# Patient Record
Sex: Male | Born: 1995 | Race: Black or African American | Hispanic: No | Marital: Single | Smoking: Current every day smoker
Health system: Southern US, Community
[De-identification: ages and names within clinical notes are randomized; demographics above are authoritative.]

## PROBLEM LIST (undated history)

## (undated) DIAGNOSIS — F319 Bipolar disorder, unspecified: Secondary | ICD-10-CM

---
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
## 2018-05-15 ENCOUNTER — Other Ambulatory Visit: Payer: Self-pay

## 2018-05-15 ENCOUNTER — Encounter (HOSPITAL_COMMUNITY): Payer: Self-pay

## 2018-05-15 ENCOUNTER — Emergency Department (HOSPITAL_COMMUNITY)
Admission: EM | Admit: 2018-05-15 | Discharge: 2018-05-16 | Disposition: A | Discharge status: Home / Self Care | Payer: PRIVATE HEALTH INSURANCE | Attending: Emergency Medicine | Admitting: Emergency Medicine

## 2018-05-15 DIAGNOSIS — F12150 Cannabis abuse with psychotic disorder with delusions: Secondary | ICD-10-CM | POA: Diagnosis not present

## 2018-05-15 DIAGNOSIS — Z046 Encounter for general psychiatric examination, requested by authority: Secondary | ICD-10-CM | POA: Diagnosis present

## 2018-05-15 DIAGNOSIS — F17210 Nicotine dependence, cigarettes, uncomplicated: Secondary | ICD-10-CM | POA: Diagnosis not present

## 2018-05-15 DIAGNOSIS — R4689 Other symptoms and signs involving appearance and behavior: Secondary | ICD-10-CM | POA: Insufficient documentation

## 2018-05-15 LAB — COMPREHENSIVE METABOLIC PANEL
ALT: 16 U/L (ref 0–44)
AST: 20 U/L (ref 15–41)
Albumin: 4.6 g/dL (ref 3.5–5.0)
Alkaline Phosphatase: 68 U/L (ref 38–126)
Anion gap: 11 (ref 5–15)
BUN: 16 mg/dL (ref 6–20)
CHLORIDE: 106 mmol/L (ref 98–111)
CO2: 26 mmol/L (ref 22–32)
CREATININE: 1.17 mg/dL (ref 0.61–1.24)
Calcium: 9.7 mg/dL (ref 8.9–10.3)
GFR calc non Af Amer: >60 mL/min (ref >60)
Glucose, Bld: 97 mg/dL (ref 70–99)
POTASSIUM: 4.1 mmol/L (ref 3.5–5.1)
SODIUM: 143 mmol/L (ref 135–145)
Total Bilirubin: 1.2 mg/dL (ref 0.3–1.2)
Total Protein: 7.7 g/dL (ref 6.5–8.1)

## 2018-05-15 LAB — CBC
HEMATOCRIT: 51.3 % (ref 39.0–52.0)
HEMOGLOBIN: 17.9 g/dL — AB (ref 13.0–17.0)
MCH: 30 pg (ref 26.0–34.0)
MCHC: 34.9 g/dL (ref 30.0–36.0)
MCV: 85.9 fL (ref 78.0–100.0)
Platelets: 168 10*3/uL (ref 150–400)
RBC: 5.97 MIL/uL — ABNORMAL HIGH (ref 4.22–5.81)
RDW: 14 % (ref 11.5–15.5)
WBC: 9.4 10*3/uL (ref 4.0–10.5)

## 2018-05-15 LAB — ETHANOL: Alcohol, Ethyl (B): <10 mg/dL (ref <10)

## 2018-05-15 LAB — SALICYLATE LEVEL: Salicylate Lvl: <7 mg/dL (ref 2.8–30.0)

## 2018-05-15 LAB — ACETAMINOPHEN LEVEL: Acetaminophen (Tylenol), Serum: <10 ug/mL — ABNORMAL LOW (ref 10–30)
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
## 2018-05-15 NOTE — Progress Notes (Signed)
TTS consulted with Donell SievertSpencer Simon, PA who recommends inpt treatment. EDP Melene PlanFloyd, Dan, DO and pt's nurse have been advised.   Princess BruinsAquicha Ardelia Wrede, MSW, LCSW Therapeutic Triage Specialist  [phone]
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
## 2018-05-16 DIAGNOSIS — F17210 Nicotine dependence, cigarettes, uncomplicated: Secondary | ICD-10-CM

## 2018-05-16 DIAGNOSIS — F12150 Cannabis abuse with psychotic disorder with delusions: Secondary | ICD-10-CM

## 2018-05-16 LAB — RAPID URINE DRUG SCREEN, HOSP PERFORMED
Amphetamines: NOT DETECTED
BENZODIAZEPINES: NOT DETECTED
Barbiturates: NOT DETECTED
Cocaine: NOT DETECTED
Opiates: NOT DETECTED
Tetrahydrocannabinol: POSITIVE — AB

## 2018-05-16 MED ORDER — NICOTINE 21 MG/24HR TD PT24
21.0000 mg | MEDICATED_PATCH | Freq: Once | TRANSDERMAL | Status: DC
  Administered 2018-05-16: 21 mg via TRANSDERMAL
  Filled 2018-05-16: qty 1

## 2018-05-16 NOTE — ED Notes (Signed)
Patient denies pain and is resting comfortably.  

## 2018-05-16 NOTE — ED Notes (Signed)
Pt given discharge instructions with mother at bedside. No concerns voiced. Left unit ambulatory accompanied by mother. Left in stable condition.
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
## 2018-05-16 NOTE — ED Notes (Signed)
Notified patient's mom, Andreasus, of pt's discharge. Report she would be here to pick him up.

## 2018-05-16 NOTE — Discharge Instructions (Signed)
For your metal health needs, please follow up at:  [facility] [address] [place], [place] [address] [phone]

## 2018-05-24 ENCOUNTER — Emergency Department (HOSPITAL_COMMUNITY)
Admission: EM | Admit: 2018-05-24 | Discharge: 2018-05-25 | Disposition: A | Discharge status: Other Acute Inpatient Hospital | Payer: PRIVATE HEALTH INSURANCE | Attending: Emergency Medicine | Admitting: Emergency Medicine

## 2018-05-24 DIAGNOSIS — Z23 Encounter for immunization: Secondary | ICD-10-CM | POA: Insufficient documentation

## 2018-05-24 DIAGNOSIS — R45851 Suicidal ideations: Secondary | ICD-10-CM | POA: Diagnosis not present

## 2018-05-24 DIAGNOSIS — Y281XXA Contact with knife, undetermined intent, initial encounter: Secondary | ICD-10-CM | POA: Insufficient documentation

## 2018-05-24 DIAGNOSIS — F17210 Nicotine dependence, cigarettes, uncomplicated: Secondary | ICD-10-CM | POA: Insufficient documentation

## 2018-05-24 DIAGNOSIS — F3110 Bipolar disorder, current episode manic without psychotic features, unspecified: Secondary | ICD-10-CM | POA: Diagnosis present

## 2018-05-24 DIAGNOSIS — S6991XA Unspecified injury of right wrist, hand and finger(s), initial encounter: Secondary | ICD-10-CM | POA: Diagnosis present

## 2018-05-24 DIAGNOSIS — R45850 Homicidal ideations: Secondary | ICD-10-CM

## 2018-05-24 DIAGNOSIS — F3163 Bipolar disorder, current episode mixed, severe, without psychotic features: Secondary | ICD-10-CM | POA: Diagnosis present

## 2018-05-24 DIAGNOSIS — F312 Bipolar disorder, current episode manic severe with psychotic features: Secondary | ICD-10-CM | POA: Insufficient documentation

## 2018-05-24 DIAGNOSIS — Y9389 Activity, other specified: Secondary | ICD-10-CM | POA: Insufficient documentation

## 2018-05-24 DIAGNOSIS — Y929 Unspecified place or not applicable: Secondary | ICD-10-CM | POA: Diagnosis not present

## 2018-05-24 DIAGNOSIS — Y999 Unspecified external cause status: Secondary | ICD-10-CM | POA: Diagnosis not present

## 2018-05-24 DIAGNOSIS — S61411A Laceration without foreign body of right hand, initial encounter: Secondary | ICD-10-CM | POA: Diagnosis not present

## 2018-05-24 DIAGNOSIS — F12150 Cannabis abuse with psychotic disorder with delusions: Secondary | ICD-10-CM | POA: Diagnosis present

## 2018-05-24 DIAGNOSIS — F419 Anxiety disorder, unspecified: Secondary | ICD-10-CM | POA: Diagnosis not present

## 2018-05-24 DIAGNOSIS — F23 Brief psychotic disorder: Secondary | ICD-10-CM | POA: Diagnosis not present

## 2018-05-24 LAB — COMPREHENSIVE METABOLIC PANEL
ALBUMIN: 4.2 g/dL (ref 3.5–5.0)
ALK PHOS: 61 U/L (ref 38–126)
ALT: 12 U/L (ref 0–44)
AST: 16 U/L (ref 15–41)
Anion gap: 8 (ref 5–15)
BILIRUBIN TOTAL: 1.1 mg/dL (ref 0.3–1.2)
BUN: 13 mg/dL (ref 6–20)
CALCIUM: 9.5 mg/dL (ref 8.9–10.3)
CO2: 27 mmol/L (ref 22–32)
CREATININE: 1.12 mg/dL (ref 0.61–1.24)
Chloride: 108 mmol/L (ref 98–111)
GFR calc Af Amer: >60 mL/min (ref >60)
GFR calc non Af Amer: >60 mL/min (ref >60)
GLUCOSE: 84 mg/dL (ref 70–99)
POTASSIUM: 3.5 mmol/L (ref 3.5–5.1)
Sodium: 143 mmol/L (ref 135–145)
TOTAL PROTEIN: 7 g/dL (ref 6.5–8.1)

## 2018-05-24 LAB — CBC WITH DIFFERENTIAL/PLATELET
BASOS PCT: 1 %
Basophils Absolute: 0 10*3/uL (ref 0.0–0.1)
Eosinophils Absolute: 0.1 10*3/uL (ref 0.0–0.7)
Eosinophils Relative: 1 %
HCT: 46.6 % (ref 39.0–52.0)
Hemoglobin: 16.3 g/dL (ref 13.0–17.0)
Lymphocytes Relative: 20 %
Lymphs Abs: 1.3 10*3/uL (ref 0.7–4.0)
MCH: 29.7 pg (ref 26.0–34.0)
MCHC: 35 g/dL (ref 30.0–36.0)
MCV: 85 fL (ref 78.0–100.0)
MONO ABS: 0.6 10*3/uL (ref 0.1–1.0)
MONOS PCT: 9 %
NEUTROS ABS: 4.4 10*3/uL (ref 1.7–7.7)
Neutrophils Relative %: 69 %
Platelets: 138 10*3/uL — ABNORMAL LOW (ref 150–400)
RBC: 5.48 MIL/uL (ref 4.22–5.81)
RDW: 13.8 % (ref 11.5–15.5)
WBC: 6.4 10*3/uL (ref 4.0–10.5)

## 2018-05-24 LAB — ETHANOL: Alcohol, Ethyl (B): <10 mg/dL (ref <10)

## 2018-05-24 MED ORDER — LORAZEPAM 1 MG PO TABS: 1.0000 mg | ORAL_TABLET | Freq: Four times a day (QID) | ORAL | Status: DC | PRN

## 2018-05-24 MED ORDER — TRAZODONE HCL 100 MG PO TABS: 100.0000 mg | ORAL_TABLET | Freq: Every day | ORAL | Status: DC

## 2018-05-24 MED ORDER — TETANUS-DIPHTH-ACELL PERTUSSIS 5-2.5-18.5 LF-MCG/0.5 IM SUSP
0.5000 mL | Freq: Once | INTRAMUSCULAR | Status: AC
  Administered 2018-05-24: 0.5 mL via INTRAMUSCULAR
  Filled 2018-05-24 (×2): qty 0.5

## 2018-05-24 MED ORDER — OLANZAPINE 5 MG PO TABS: 5.0000 mg | ORAL_TABLET | Freq: Every day | ORAL | Status: DC

## 2018-05-24 MED ORDER — HYDROXYZINE HCL 25 MG PO TABS: 25.0000 mg | ORAL_TABLET | Freq: Three times a day (TID) | ORAL | Status: DC | PRN

## 2018-05-24 MED ORDER — ACETAMINOPHEN 325 MG PO TABS: 650.0000 mg | ORAL_TABLET | ORAL | Status: DC | PRN

## 2018-05-24 MED ORDER — ZIPRASIDONE MESYLATE 20 MG IM SOLR
20.0000 mg | Freq: Once | INTRAMUSCULAR | Status: AC | PRN
  Administered 2018-05-24: 20 mg via INTRAMUSCULAR
  Filled 2018-05-24: qty 20

## 2018-05-24 MED ORDER — ALUM & MAG HYDROXIDE-SIMETH 200-200-20 MG/5ML PO SUSP: 30.0000 mL | Freq: Four times a day (QID) | ORAL | Status: DC | PRN

## 2018-05-24 MED ORDER — STERILE WATER FOR INJECTION IJ SOLN
INTRAMUSCULAR | Status: AC
  Administered 2018-05-24: 19:00:00
  Filled 2018-05-24: qty 10

## 2018-05-24 MED ORDER — LORAZEPAM 2 MG/ML IJ SOLN: 1.0000 mg | Freq: Four times a day (QID) | INTRAMUSCULAR | Status: DC | PRN

## 2018-05-24 MED ORDER — LORAZEPAM 1 MG PO TABS
2.0000 mg | ORAL_TABLET | Freq: Once | ORAL | Status: DC
  Filled 2018-05-24: qty 2

## 2018-05-24 MED ORDER — ONDANSETRON HCL 4 MG PO TABS: 4.0000 mg | ORAL_TABLET | Freq: Three times a day (TID) | ORAL | Status: DC | PRN

## 2018-05-24 MED ORDER — LORAZEPAM 2 MG/ML IJ SOLN
2.0000 mg | Freq: Once | INTRAMUSCULAR | Status: AC | PRN
  Administered 2018-05-24: 2 mg via INTRAMUSCULAR
  Filled 2018-05-24: qty 1

## 2018-05-24 MED ORDER — DIPHENHYDRAMINE HCL 50 MG/ML IJ SOLN
25.0000 mg | Freq: Once | INTRAMUSCULAR | Status: AC | PRN
  Administered 2018-05-24: 25 mg via INTRAMUSCULAR
  Filled 2018-05-24: qty 1

## 2018-05-24 MED ORDER — BACITRACIN ZINC 500 UNIT/GM EX OINT
TOPICAL_OINTMENT | Freq: Two times a day (BID) | CUTANEOUS | Status: DC
  Administered 2018-05-24: 11:00:00 via TOPICAL
  Administered 2018-05-25: 1 via TOPICAL
  Filled 2018-05-24 (×2): qty 0.9
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
## 2018-05-24 NOTE — ED Notes (Signed)
[employer] and security outside patient room.

## 2018-05-24 NOTE — ED Notes (Signed)
Security presence outside patient door.

## 2018-05-24 NOTE — ED Notes (Signed)
Security has been called to stand by outside patient room due to patient being verbally aggressive.

## 2018-05-24 NOTE — ED Notes (Signed)
Pt becoming verbally aggressive and threatening staff

## 2018-05-24 NOTE — ED Notes (Signed)
Pt angry and yelling at MHT. MHT and RN attempting to verbally deescalate pt. Pt becoming more aggressive during attempted conversation.  Off duty officer attempting to have a conversation with patient regarding involuntary commitment. Pt screaming at RN and MHT that he does not have to be here and he can leave if he wants to.
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
## 2018-05-24 NOTE — BHH Counselor (Signed)
Patient assessed and case staffed with Elta GuadeloupeLaurie Parks, NP, who recommends patient meets criteria for inpatient hospitalizations.

## 2018-05-24 NOTE — ED Notes (Signed)
[facility] Expected date:  Expected time:  Means of arrival:  Comments: Hold for ED 18

## 2018-05-24 NOTE — ED Notes (Signed)
Pt heard on the phone threatening people and stating "I could kill you bro!" and pt becoming more verbally aggressive.

## 2018-05-24 NOTE — ED Notes (Signed)
Patient right anterior palm has been cleaned and dressed.

## 2018-05-24 NOTE — ED Notes (Signed)
Pt had multiple small lacerations on his hands and RN put Band-Aid on his fingers and placed telfa and coband on pt's left thumb

## 2018-05-24 NOTE — ED Notes (Signed)
Security outside patient room

## 2018-05-24 NOTE — ED Notes (Signed)
Patient escorted to [facility] by Security, [employer] and [place].

## 2018-05-24 NOTE — ED Notes (Signed)
Pt sleeping at present, sedated,  Arouseable to deep verbal stimuli.  Skin color good, resp even and unlabored.  Monitoring for safety, Q 15 min checks in effect.
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
## 2018-05-24 NOTE — ED Notes (Signed)
Pt has been dressed out in purple scrubs / wanded by security and has safety sitter at bedside.

## 2018-05-25 ENCOUNTER — Encounter (HOSPITAL_COMMUNITY): Payer: Self-pay | Admitting: *Deleted

## 2018-05-25 ENCOUNTER — Inpatient Hospital Stay (HOSPITAL_COMMUNITY)
Admission: AD | Admit: 2018-05-25 | Discharge: 2018-05-29 | DRG: 885 | Disposition: A | Discharge status: Home / Self Care | Payer: PRIVATE HEALTH INSURANCE | Source: Intra-hospital | Attending: Psychiatry | Admitting: Psychiatry

## 2018-05-25 ENCOUNTER — Other Ambulatory Visit: Payer: Self-pay

## 2018-05-25 DIAGNOSIS — F419 Anxiety disorder, unspecified: Secondary | ICD-10-CM | POA: Diagnosis present

## 2018-05-25 DIAGNOSIS — R45850 Homicidal ideations: Secondary | ICD-10-CM

## 2018-05-25 DIAGNOSIS — Z590 Homelessness: Secondary | ICD-10-CM | POA: Diagnosis not present

## 2018-05-25 DIAGNOSIS — F17210 Nicotine dependence, cigarettes, uncomplicated: Secondary | ICD-10-CM | POA: Diagnosis present

## 2018-05-25 DIAGNOSIS — F3163 Bipolar disorder, current episode mixed, severe, without psychotic features: Secondary | ICD-10-CM | POA: Diagnosis present

## 2018-05-25 DIAGNOSIS — F3110 Bipolar disorder, current episode manic without psychotic features, unspecified: Secondary | ICD-10-CM | POA: Diagnosis present

## 2018-05-25 DIAGNOSIS — F602 Antisocial personality disorder: Secondary | ICD-10-CM | POA: Diagnosis present

## 2018-05-25 DIAGNOSIS — Z915 Personal history of self-harm: Secondary | ICD-10-CM

## 2018-05-25 DIAGNOSIS — R45851 Suicidal ideations: Secondary | ICD-10-CM | POA: Diagnosis present

## 2018-05-25 DIAGNOSIS — F23 Brief psychotic disorder: Secondary | ICD-10-CM | POA: Diagnosis not present

## 2018-05-25 DIAGNOSIS — F1220 Cannabis dependence, uncomplicated: Secondary | ICD-10-CM | POA: Diagnosis not present

## 2018-05-25 DIAGNOSIS — F1210 Cannabis abuse, uncomplicated: Secondary | ICD-10-CM | POA: Diagnosis present

## 2018-05-25 DIAGNOSIS — S61411A Laceration without foreign body of right hand, initial encounter: Secondary | ICD-10-CM | POA: Diagnosis not present

## 2018-05-25 DIAGNOSIS — G4700 Insomnia, unspecified: Secondary | ICD-10-CM | POA: Diagnosis present

## 2018-05-25 DIAGNOSIS — J00 Acute nasopharyngitis [common cold]: Secondary | ICD-10-CM | POA: Diagnosis present

## 2018-05-25 DIAGNOSIS — Z639 Problem related to primary support group, unspecified: Secondary | ICD-10-CM | POA: Diagnosis not present

## 2018-05-25 LAB — RAPID URINE DRUG SCREEN, HOSP PERFORMED
AMPHETAMINES: NOT DETECTED
BARBITURATES: NOT DETECTED
BENZODIAZEPINES: POSITIVE — AB
Cocaine: NOT DETECTED
Opiates: NOT DETECTED
Tetrahydrocannabinol: POSITIVE — AB

## 2018-05-25 MED ORDER — TRAZODONE HCL 100 MG PO TABS
100.0000 mg | ORAL_TABLET | Freq: Every day | ORAL | Status: DC
  Filled 2018-05-25 (×3): qty 1

## 2018-05-25 MED ORDER — ONDANSETRON HCL 4 MG PO TABS: 4.0000 mg | ORAL_TABLET | Freq: Three times a day (TID) | ORAL | Status: DC | PRN

## 2018-05-25 MED ORDER — HYDROXYZINE HCL 25 MG PO TABS: 25.0000 mg | ORAL_TABLET | Freq: Three times a day (TID) | ORAL | Status: DC | PRN

## 2018-05-25 MED ORDER — NICOTINE 21 MG/24HR TD PT24
21.0000 mg | MEDICATED_PATCH | Freq: Every day | TRANSDERMAL | Status: DC
  Filled 2018-05-25 (×3): qty 1

## 2018-05-25 MED ORDER — LORAZEPAM 2 MG/ML IJ SOLN
1.0000 mg | Freq: Four times a day (QID) | INTRAMUSCULAR | Status: AC | PRN
  Filled 2018-05-25: qty 1

## 2018-05-25 MED ORDER — OLANZAPINE 5 MG PO TABS
5.0000 mg | ORAL_TABLET | Freq: Every day | ORAL | Status: DC
  Filled 2018-05-25 (×3): qty 1

## 2018-05-25 MED ORDER — MAGNESIUM HYDROXIDE 400 MG/5ML PO SUSP: 30.0000 mL | Freq: Every day | ORAL | Status: DC | PRN

## 2018-05-25 MED ORDER — ACETAMINOPHEN 325 MG PO TABS
650.0000 mg | ORAL_TABLET | Freq: Four times a day (QID) | ORAL | Status: DC | PRN
  Administered 2018-05-26 – 2018-05-29 (×2): 650 mg via ORAL
  Filled 2018-05-25 (×2): qty 2

## 2018-05-25 MED ORDER — ALUM & MAG HYDROXIDE-SIMETH 200-200-20 MG/5ML PO SUSP: 30.0000 mL | ORAL | Status: DC | PRN

## 2018-05-25 MED ORDER — HYDROXYZINE HCL 25 MG PO TABS
25.0000 mg | ORAL_TABLET | Freq: Three times a day (TID) | ORAL | Status: DC | PRN
  Administered 2018-05-25: 25 mg via ORAL
  Filled 2018-05-25: qty 1

## 2018-05-25 MED ORDER — LORAZEPAM 1 MG PO TABS
1.0000 mg | ORAL_TABLET | Freq: Four times a day (QID) | ORAL | Status: AC | PRN
  Administered 2018-05-25: 1 mg via ORAL

## 2018-05-25 MED ORDER — NICOTINE POLACRILEX 2 MG MT GUM: 2.0000 mg | CHEWING_GUM | OROMUCOSAL | Status: DC | PRN

## 2018-05-25 MED ORDER — NICOTINE 21 MG/24HR TD PT24: 21.0000 mg | MEDICATED_PATCH | Freq: Every day | TRANSDERMAL | Status: DC

## 2018-05-25 MED ORDER — BACITRACIN ZINC 500 UNIT/GM EX OINT
TOPICAL_OINTMENT | Freq: Two times a day (BID) | CUTANEOUS | Status: DC
  Administered 2018-05-26: 10:00:00 via TOPICAL
  Administered 2018-05-26: 1 via TOPICAL
  Administered 2018-05-27 – 2018-05-29 (×5): via TOPICAL
  Filled 2018-05-25: qty 28.35

## 2018-05-25 MED ORDER — LORAZEPAM 1 MG PO TABS
1.0000 mg | ORAL_TABLET | ORAL | Status: DC | PRN
  Filled 2018-05-25: qty 1

## 2018-05-25 MED ORDER — ZIPRASIDONE MESYLATE 20 MG IM SOLR
20.0000 mg | Freq: Two times a day (BID) | INTRAMUSCULAR | Status: DC | PRN
  Administered 2018-05-25: 20 mg via INTRAMUSCULAR
  Filled 2018-05-25 (×2): qty 20

## 2018-05-25 MED ORDER — TRAZODONE HCL 50 MG PO TABS
50.0000 mg | ORAL_TABLET | Freq: Every evening | ORAL | Status: DC | PRN
  Administered 2018-05-26: 50 mg via ORAL
  Filled 2018-05-25: qty 1

## 2018-05-25 NOTE — ED Notes (Signed)
Wounds to right hand cleansed and bacitracin applied, covered with dsg.

## 2018-05-25 NOTE — Progress Notes (Signed)
Dar Note: Staff returned patient back from [employer] with blunted affect and irritable mood.  Patient states he doesn't want to be there because he wasn't hungry.  Patient was on the phone yelling and threatening to leave against medical advice.  Patient slammed the phone down and walked to his room kicking the garbage can.    Patient received Ativan 1 mg with Vistaril 25 mg after several attempts and encouragement.  Routine safety checks maintained every 15 minutes.  Continues to need a lot of redirection on the unit.

## 2018-05-25 NOTE — ED Notes (Signed)
Pt is very labile.  He states that he "Always want to kill people and make them suffer.  He ask if I know what [place] means,  Stated it means you cant have sympathy in this world, it gets you Nowhere."  He also states that he hasnt eaten in 5 days.   He denies S/I but endorses H/I.  Pt denies AVH.   15 minute checks and video monitoring continue.

## 2018-05-25 NOTE — Progress Notes (Signed)
Patient ID: Larry [name], male   DOB: 10/13/1995, 22 y.o.   MRN: [id number]  MHT reports patient spit out his oral medications. Patient stated, "I didn't take that shit". Patient then reported, "I am leaving tonight".

## 2018-05-25 NOTE — ED Notes (Signed)
Pt discharged safely with [employer].  Pt was calm and cooperative at discharge.  All belongings were sent with patient.

## 2018-05-25 NOTE — Progress Notes (Signed)
Did not attend group 

## 2018-05-25 NOTE — Progress Notes (Signed)
Patient ID: Maudry Mayhewony Orren, male   DOB: 04/19/1996, 22 y.o.   MRN: [id number]  Patient became verbally aggressive on the phone. Patient threw the phone receiver against the wall and started to curse/yell in the hallway. MHT attempted to verbally deescalate patient but patient refused to engage. Patient then attempted to elope out the 500 hall entrance and began to block the doorway. Patient continued to demand to leave and would not engage with staff. Security, AC and on-coming shift notified. Show of force was called. Patient verbally agreed to take IM Geodon and IM Ativan into his L deltoid. Injections received at 1714. Patient tolerated injection well and endorsed some injection site discomfort. Patient remains agitated but is observed sitting by the hallway telephone. Will continue to monitor and support.

## 2018-05-25 NOTE — Tx Team (Signed)
Initial Treatment Plan 05/25/2018 5:21 PM Larry Mayhewony Oien [id number]    PATIENT STRESSORS: Financial difficulties Legal issue Marital or family conflict Medication change or noncompliance Substance abuse   PATIENT STRENGTHS: Ability for insight Average or above average intelligence Capable of independent living General fund of knowledge   PATIENT IDENTIFIED PROBLEMS: Depression Suicidal thoughts Homicidal thoughts "I don't know but I want something different but I'm going right back to the same thing when I get out of here"                     DISCHARGE CRITERIA:  Ability to meet basic life and health needs Improved stabilization in mood, thinking, and/or behavior Reduction of life-threatening or endangering symptoms to within safe limits Verbal commitment to aftercare and medication compliance  PRELIMINARY DISCHARGE PLAN: Attend aftercare/continuing care group Return to previous living arrangement  PATIENT/FAMILY INVOLVEMENT: This treatment plan has been presented to and reviewed with the patient, Larry [name], and/or family member, .  The patient and family have been given the opportunity to ask questions and make suggestions.  Arlone Lenhardt, CatarinaBrook Wayne, [place]RN 05/25/2018, 5:21 PM

## 2018-05-25 NOTE — Consult Note (Addendum)
[facility] Consult   Reason for Consult:  Aggressive behavior Referring Physician:  EDP Patient Identification: Larry [name] MRN:  [id number] Principal Diagnosis: Acute psychosis [facility]) Diagnosis:   Patient Active Problem List   Diagnosis Date Noted  . 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 male patient admitted with psychosis with aggressive behavior.  HPI:  Pt was seen and chart reviewed with treatment team and Dr Mariea Clonts. Pt stated he has thoughts of wanting to kill people but stated " If you make me mad then I would kill your mother so you would suffer over that." He also stated he is angry and gets aggressive and breaks things. He stated he used to take medication and it helped but then he flushed it down the toilet because "why should I take pills?" Pt stated he has memory loss. He also stated he smokes 40 blunts and 100 cigarettes per day. Pt is tangential and appears to be responding to internal stimuli. Pt's UDS positive for THC and benzodiazepines, BAL negative. Pt does not get along with his step-dad and his step-dad is afraid for Pt's mother if pt is left alone with her because he is unstable. Pt would benefit from an inpatient psychiatric admission for crisis stabilization and medication management.   Past Psychiatric History: As above  Risk to Self: None. Denies SI.  Comment - Self Injurious Behavior: pt has hx of cutting  Risk to Others: Homicidal Ideation: Yes-Currently Present(pt report he will kill others, no specific person discussed ) Thoughts of Harm to Others: Yes-Currently Present Comment - Thoughts of Harm to Others: pt report thoughts of wanting to kill others, no specific person  Current Homicidal Intent: No Current Homicidal Plan: No Access to Homicidal Means: No Identified Victim: no specific person discussed  History of  harm to others?: Yes Assessment of Violence: On admission Violent Behavior Description: pt states if he's having a bad day, everyone will have a bad dad(report if he suffers, everyone suffers) Does patient have access to weapons?: No Criminal Charges Pending?: Yes Describe Pending Criminal Charges: destruction of property(not sure of court date) Does patient have a court date: Yes(patient not sure of his court date) Court Date: (pt unsure of court date) Prior Inpatient Therapy: Prior Inpatient Therapy: No Prior Outpatient Therapy: Prior Outpatient Therapy: Yes Prior Therapy Dates: current Prior Therapy Facilty/Provider(s): unknown Reason for Treatment: anger management  Does patient have an ACCT team?: No Does patient have Intensive In-House Services?  : No Does patient have [facility] services? : No Does patient have P4CC services?: No  Past Medical History: No past medical history on file. No past surgical history on file. Family History: No family history on file. Family Psychiatric  History: Unknown  Social History:  Social History   Substance and Sexual Activity  Alcohol Use No     Social History   Substance and Sexual Activity  Drug Use No    Social History   Socioeconomic History  . Marital status: Single    Spouse name: Not on file  . Number of children: Not on file  . Years of education: Not on file  . Highest education level: Not on file  Occupational History  . Not on file  Social Needs  . Financial resource strain: Not on file  . Food insecurity:    Worry: Not on file  Inability: Not on file  . Transportation needs:    Medical: Not on file    Non-medical: Not on file  Tobacco Use  . Smoking status: Current Every Day Smoker    Types: Cigarettes  Substance and Sexual Activity  . Alcohol use: No  . Drug use: No  . Sexual activity: Not on file  Lifestyle  . Physical activity:    Days per week: Not on file    Minutes per session: Not on file  .  Stress: Not on file  Relationships  . Social connections:    Talks on phone: Not on file    Gets together: Not on file    Attends religious service: Not on file    Active member of club or organization: Not on file    Attends meetings of clubs or organizations: Not on file    Relationship status: Not on file  Other Topics Concern  . Not on file  Social History Narrative  . Not on file   Additional Social History: He lives at home with his mother and stepfather. He smokes marijuana daily.     Allergies:   Allergies  Allergen Reactions  . Zithromax [Azithromycin Dihydrate] Rash    Labs:  Results for orders placed or performed during the hospital encounter of 05/24/18 (from the past 48 hour(s))  Comprehensive metabolic panel     Status: None   Collection Time: 05/24/18 11:30 AM  Result Value Ref Range   Sodium 143 135 - 145 mmol/L   Potassium 3.5 3.5 - 5.1 mmol/L   Chloride 108 98 - 111 mmol/L   CO2 27 22 - 32 mmol/L   Glucose, Bld 84 70 - 99 mg/dL   BUN 13 6 - 20 mg/dL   Creatinine, Ser 1.12 0.61 - 1.24 mg/dL   Calcium 9.5 8.9 - 10.3 mg/dL   Total Protein 7.0 6.5 - 8.1 g/dL   Albumin 4.2 3.5 - 5.0 g/dL   AST 16 15 - 41 U/L   ALT 12 0 - 44 U/L   Alkaline Phosphatase 61 38 - 126 U/L   Total Bilirubin 1.1 0.3 - 1.2 mg/dL   GFR calc non Af Amer >60 >60 mL/min   GFR calc Af Amer >60 >60 mL/min    Comment: (NOTE) The eGFR has been calculated using the CKD EPI equation. This calculation has not been validated in all clinical situations. eGFR's persistently <60 mL/min signify possible Chronic Kidney Disease.    Anion gap 8 5 - 15    Comment: Performed at [facility], [place] [address] [place], [place] [address]  Ethanol     Status: None   Collection Time: 05/24/18 11:30 AM  Result Value Ref Range   Alcohol, Ethyl (B) <10 <10 mg/dL    Comment: (NOTE) Lowest detectable limit for serum alcohol is 10 mg/dL. For medical purposes only. Performed at [facility], [place] [address], [place], [place] [address]   CBC with Diff     Status: Abnormal   Collection Time: 05/24/18 11:30 AM  Result Value Ref Range   WBC 6.4 4.0 - 10.5 K/uL   RBC 5.48 4.22 - 5.81 MIL/uL   Hemoglobin 16.3 13.0 - 17.0 g/dL   HCT 46.6 39.0 - 52.0 %   MCV 85.0 78.0 - 100.0 fL   MCH 29.7 26.0 - 34.0 pg   MCHC 35.0 30.0 - 36.0 g/dL   RDW 13.8 11.5 - 15.5 %   Platelets 138 (L)  150 - 400 K/uL   Neutrophils Relative % 69 %   Neutro Abs 4.4 1.7 - 7.7 K/uL   Lymphocytes Relative 20 %   Lymphs Abs 1.3 0.7 - 4.0 K/uL   Monocytes Relative 9 %   Monocytes Absolute 0.6 0.1 - 1.0 K/uL   Eosinophils Relative 1 %   Eosinophils Absolute 0.1 0.0 - 0.7 K/uL   Basophils Relative 1 %   Basophils Absolute 0.0 0.0 - 0.1 K/uL    Comment: Performed at [facility], [place] [address] [place], [place] [address]  Urine rapid drug screen (hosp performed)     Status: Abnormal   Collection Time: 05/25/18  9:03 AM  Result Value Ref Range   Opiates NONE DETECTED NONE DETECTED   Cocaine NONE DETECTED NONE DETECTED   Benzodiazepines POSITIVE (A) NONE DETECTED   Amphetamines NONE DETECTED NONE DETECTED   Tetrahydrocannabinol POSITIVE (A) NONE DETECTED   Barbiturates NONE DETECTED NONE DETECTED    Comment: (NOTE) DRUG SCREEN FOR MEDICAL PURPOSES ONLY.  IF CONFIRMATION IS NEEDED FOR ANY PURPOSE, NOTIFY LAB WITHIN 5 DAYS. LOWEST DETECTABLE LIMITS FOR URINE DRUG SCREEN Drug Class                     Cutoff (ng/mL) Amphetamine and metabolites    1000 Barbiturate and metabolites    200 Benzodiazepine                 366 Tricyclics and metabolites     300 Opiates and metabolites        300 Cocaine and metabolites        300 THC                            50 Performed at [facility], [place] [address], [place], [place] [address]     Current Facility-Administered Medications  Medication Dose Route Frequency Provider Last Rate Last Dose   . acetaminophen (TYLENOL) tablet 650 mg  650 mg Oral Q4H PRN Duffy Bruce, MD      . alum & mag hydroxide-simeth (MAALOX/MYLANTA) 200-200-20 MG/5ML suspension 30 mL  30 mL Oral Q6H PRN Duffy Bruce, MD      . bacitracin ointment   Topical BID Duffy Bruce, MD   1 application at 74/25/95 0555  . hydrOXYzine (ATARAX/VISTARIL) tablet 25 mg  25 mg Oral TID PRN Ethelene Hal, NP      . LORazepam (ATIVAN) injection 1 mg  1 mg Intramuscular Q6H PRN Ethelene Hal, NP       Or  . LORazepam (ATIVAN) tablet 1 mg  1 mg Oral Q6H PRN Ethelene Hal, NP      . nicotine (NICODERM CQ - dosed in mg/24 hours) patch 21 mg  21 mg Transdermal Daily Faythe Dingwall, DO   Stopped at 05/25/18 1353  . nicotine polacrilex (NICORETTE) gum 2 mg  2 mg Oral PRN Ethelene Hal, NP      . OLANZapine [facility]) tablet 5 mg  5 mg Oral QHS Ethelene Hal, NP   Stopped at 05/24/18 2124  . ondansetron (ZOFRAN) tablet 4 mg  4 mg Oral Q8H PRN Duffy Bruce, MD      . traZODone (DESYREL) tablet 100 mg  100 mg Oral QHS Ethelene Hal, NP   Stopped at 05/24/18 2124   No current outpatient medications on file.    Musculoskeletal: Strength & Muscle Tone:  within normal limits Gait & Station: normal Patient leans: N/A  Psychiatric Specialty Exam: Physical Exam  Nursing note and vitals reviewed. Constitutional: He is oriented to person, place, and time. He appears well-developed and well-nourished.  HENT:  Head: Normocephalic and atraumatic.  Neck: Normal range of motion.  Respiratory: Effort normal.  Musculoskeletal: Normal range of motion.  Neurological: He is alert and oriented to person, place, and time.  Psychiatric: His mood appears anxious. His affect is labile. His speech is rapid and/or pressured. He is agitated. Thought content is delusional. Cognition and memory are normal. He expresses impulsivity.    Review of Systems  Psychiatric/Behavioral: Positive for  substance abuse. Negative for depression, hallucinations, memory loss and suicidal ideas. The patient is nervous/anxious. The patient does not have insomnia.   All other systems reviewed and are negative.   Blood pressure 133/88, pulse (!) 58, temperature 98 F (36.7 C), temperature source Oral, resp. rate 14, SpO2 100 %.There is no height or weight on file to calculate BMI.  General Appearance: Casual  Eye Contact:  Good  Speech:  Clear and Coherent and Pressured  Volume:  Normal  Mood:  Anxious and Irritable  Affect:  Congruent and Labile  Thought Process:  Disorganized  Orientation:  Full (Time, Place, and Person)  Thought Content:  Illogical  Suicidal Thoughts:  No  Homicidal Thoughts:  Yes.  with intent/plan  Memory:  Immediate;   Good Recent;   Fair Remote;   Fair  Judgement:  Poor  Insight:  Lacking  Psychomotor Activity:  Increased  Concentration:  Concentration: Fair and Attention Span: Fair  Recall:  [employer] of Knowledge:  Fair  Language:  Good  Akathisia:  No  Handed:  Right  AIMS (if indicated):   N/A  Assets:  Agricultural consultant Housing  ADL's:  Intact  Cognition:  WNL  Sleep:   Fair     Treatment Plan Summary: Daily contact with patient to assess and evaluate symptoms and progress in treatment and Medication management (see MAR )  Disposition: Recommend psychiatric Inpatient admission when medically cleared.   Ethelene Hal, NP 05/25/2018 2:16 PM   Patient seen face-to-face for psychiatric evaluation, chart reviewed and case discussed with the physician extender and developed treatment plan. Reviewed the information documented and agree with the treatment plan.  Buford Dresser, DO 05/25/18 3:25 PM

## 2018-05-25 NOTE — Progress Notes (Signed)
Larry [name] is a 22 year old male pt admitted on involuntary basis. On admission, he reports that he was brought into the hospital by the police after he got into a fight with his girlfriend and her father. He does endorse passive SI and spoke about how he was gonna kill someone when he got discharged but would not specify who. He reports that he was on medications in the past but reports that it's been a couple months since he has had any. He does endorse daily marijuana usage but denies any other substance abuse issues. He reports that he has been in jail before and spoke about he was in for stealing. He reports that he does what he has to do to survive. He reports that he lives with his mother and reports that he can go back there after discharge. Larry [name] was escorted to the unit, oriented to the milieu and safety maintained.

## 2018-05-25 NOTE — BH Assessment (Signed)
Pt recommended for inpatient psychiatric treatment per Dr. Sharma CovertNorman. [facility]  Bed assigned 502 Bed available now.  Nurse to nurse report [phone] . Relayed above information to primary RN. Patient to be transported via [employer] examiner[employer] enforcement.

## 2018-05-25 NOTE — Progress Notes (Signed)
D: Pt was approached with IM medication due to the aggressive behavior exhibited earlier, per 1st shift. Pt was not observed with aggressive behavior, pt was agitated and was expressing his desire to leave. Pt was not receptive to talking to writer at this time , so not much interaction . Pt took IMs willingly, pt voiced his frustration due to getting IMs in the ED .   A: Pt was offered support and encouragement. Pt was given scheduled medications. Pt was encourage to attend groups. Q 15 minute checks were done for safety.  R: safety maintained on unit. Problem: Safety: Goal: Periods of time without injury will increase Outcome: Progressing

## 2018-05-26 ENCOUNTER — Other Ambulatory Visit: Payer: Self-pay

## 2018-05-26 DIAGNOSIS — F602 Antisocial personality disorder: Secondary | ICD-10-CM

## 2018-05-26 DIAGNOSIS — G4700 Insomnia, unspecified: Secondary | ICD-10-CM

## 2018-05-26 DIAGNOSIS — F1220 Cannabis dependence, uncomplicated: Secondary | ICD-10-CM

## 2018-05-26 MED ORDER — DIVALPROEX SODIUM 500 MG PO DR TAB
500.0000 mg | DELAYED_RELEASE_TABLET | Freq: Two times a day (BID) | ORAL | Status: DC
  Administered 2018-05-26 – 2018-05-29 (×6): 500 mg via ORAL
  Filled 2018-05-26 (×10): qty 1

## 2018-05-26 MED ORDER — ZIPRASIDONE MESYLATE 20 MG IM SOLR: 20.0000 mg | Freq: Two times a day (BID) | INTRAMUSCULAR | Status: DC | PRN

## 2018-05-26 MED ORDER — NICOTINE POLACRILEX 2 MG MT GUM
2.0000 mg | CHEWING_GUM | OROMUCOSAL | Status: DC | PRN
  Administered 2018-05-28: 2 mg via ORAL

## 2018-05-26 MED ORDER — RISPERIDONE 1 MG PO TABS
1.0000 mg | ORAL_TABLET | Freq: Two times a day (BID) | ORAL | Status: DC
  Administered 2018-05-26 – 2018-05-28 (×4): 1 mg via ORAL
  Filled 2018-05-26 (×6): qty 1

## 2018-05-26 MED ORDER — RISPERIDONE 2 MG PO TBDP
2.0000 mg | ORAL_TABLET | Freq: Three times a day (TID) | ORAL | Status: DC | PRN
  Administered 2018-05-26: 2 mg via ORAL
  Filled 2018-05-26: qty 2

## 2018-05-26 NOTE — BHH Group Notes (Signed)
LCSW Group Therapy Note  05/26/2018 1:15pm  Type of Therapy/Topic:  Group Therapy:  Feelings about Diagnosis  Participation Level:  Active   Description of Group:   This group will allow patients to explore their thoughts and feelings about diagnoses they have received. Patients will be guided to explore their level of understanding and acceptance of these diagnoses. Facilitator will encourage patients to process their thoughts and feelings about the reactions of others to their diagnosis and will guide patients in identifying ways to discuss their diagnosis with significant others in their lives. This group will be process-oriented, with patients participating in exploration of their own experiences, giving and receiving support, and processing challenge from other group members.   Therapeutic Goals: 1. Patient will demonstrate understanding of diagnosis as evidenced by identifying two or more symptoms of the disorder 2. Patient will be able to express two feelings regarding the diagnosis 3. Patient will demonstrate their ability to communicate their needs through discussion and/or role play  Summary of Patient Progress:  Stayed the entire time, engaged throughout.  Silent until asked directly for this thoughts about 30 minutes into the group.  Then spoke for the next 15 minutes about his lack of supports, the many friends that are either dead or in prison, his hopelessness and resulting SI, and feeling like no one ever listens to him.  Also, that he is homeless due to strained relationship with mother, unemployed and feels unemployable as he dropped out in 7th grade and is unable to read or write.     Therapeutic Modalities:   Cognitive Behavioral Therapy Brief Therapy Feelings Identification    Larry RogueRodney B [name] [name], LCSW 05/26/2018 2:14 PM

## 2018-05-26 NOTE — Progress Notes (Signed)
Recreation Therapy Notes  INPATIENT RECREATION THERAPY ASSESSMENT  Patient Details Name: Larry [name] MRN: [id number] DOB: 10/24/1995 Today's Date: 05/26/2018       Information Obtained From: Patient  Able to Participate in Assessment/Interview: Yes  Patient Presentation: Alert  Reason for Admission (Per Patient): Suicide Attempt  Patient Stressors: Relationship  Coping Skills:   Self-Injury, Sports, TV, Arguments, Aggression, Music, Substance Abuse, Impulsivity, Talk, Avoidance, Hot Bath/Shower  Leisure Interests (2+):  Individual - Other (Comment)(Smoke weed)  Frequency of Recreation/Participation: Other (Comment)(Daily)  Awareness of Community Resources:  Yes  Community Resources:  Other (Comment)(Dog shelter)  Current Use: No  If no, Barriers?:    Expressed Interest in State [employer] CorporationCommunity Resource Information: Yes  County of Residence:  [place]  Patient Main Form of Transportation: Walk  Patient Strengths:  MudloggerGood Listner; Can vent to people  Patient Identified Areas of Improvement:  Reading; Math  Patient Goal for Hospitalization:  "Keep a positive mind, try to get my life together"  Current SI (including self-harm):  No  Current HI:  No  Current AVH: No  Staff Intervention Plan: Group Attendance, Collaborate with Interdisciplinary Treatment Team  Consent to Intern Participation: N/A    Caroll RancherMarjette Easton Sivertson, LRT/CTRS   Lillia AbedLindsay, Claud Gowan A 05/26/2018, 1:16 PM

## 2018-05-26 NOTE — [facility] Suicide Risk Assessment (Signed)
[facility]   Nursing information obtained from:  Patient Demographic factors:  Male, Adolescent or young adult, Low socioeconomic status, Unemployed, Access to firearms Current Mental Status:  Suicidal ideation indicated by patient, Self-harm thoughts Loss Factors:  Legal issues, Financial problems / change in socioeconomic status Historical Factors:  Family history of mental illness or substance abuse Risk Reduction Factors:  Positive coping skills or problem solving skills  Total Time spent with patient: 1 hour Principal Problem: Bipolar I, most recent episode mixed, severe (HCC) Diagnosis:   Patient Active Problem List   Diagnosis Date Noted  . Antisocial personality disorder (HCC) [F60.2] 05/26/2018  . Severe cannabis use disorder (HCC) [F12.20] 05/26/2018  . Bipolar I, most recent episode mixed, severe (HCC) [F31.63] 05/25/2018  . 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  World Science writerHealth Organization [facility](WHO). Score between 0-7:  no or low risk or alcohol related problems. Score between 8-15:  moderate risk of alcohol related problems. Score between 16-19:  high risk of alcohol related problems. Score 20 or above:  warrants further diagnostic evaluation for alcohol dependence and treatment.   Psychiatric Specialty Exam: Physical Exam  Nursing note and vitals reviewed.     Blood pressure 121/80, pulse 81, temperature 98.3 F (36.8 C), temperature source Oral, resp. rate 18, height 5\' 10"  (1.778 m), weight 70.8 kg.Body mass index is 22.38 kg/m.    COGNITIVE FEATURES THAT CONTRIBUTE TO RISK:  Closed-mindedness, Polarized thinking and Thought constriction (tunnel vision)    SUICIDE RISK:   Severe:  Frequent, intense, and enduring suicidal ideation, specific plan, no subjective intent, but some objective markers of intent (i.e., choice of lethal method), the method is accessible, some limited preparatory behavior, evidence of impaired self-control, severe dysphoria/symptomatology, multiple risk factors present, and few if any protective factors, particularly a lack of social support.  PLAN OF CARE: see H&P  I certify that inpatient services furnished can reasonably be expected to improve the patient's condition.   Micheal Likenshristopher T Maicey Barrientez, MD 05/26/2018, 4:34 PM
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
## 2018-05-26 NOTE — Progress Notes (Signed)
Pt is calm, cooperative, pleasant and friendly.  Pt makes good eye contact and sts he wants to get back on his meds and go home.  Pt sts he wants to go back to school and get a degree.   Pt denies pain or discomfort.  Pt denies SI, HI and AVH. Pt remains safe on unit at this time.

## 2018-05-26 NOTE — Plan of Care (Addendum)
Problem: Safety: Goal: Periods of time without injury will increase Outcome: Progressing   Problem: Medication: Goal: Compliance with prescribed medication regimen will improve Outcome: Progressing D: Pt presents with blunted affect and is irritable on interactions. Denies SI, HI and AVH. Pt's mood has been labile. Verbal outburst X1 while talking to mother "man, you don't give a fuck about me, it's that damn fat ass mother fucker you with, my mama has never told me that I'm a horse, now you're telling me I sound like a damn horse, I'll kill that mother fucker when I get out". Refused PRN for agitation when offered. Rates his depression 6/10, hopelessness 0/10 and anxiety 9/10 on self inventory sheet.  Reports he slept well last night with good appetite, high energy and good concentration level on self inventory sheet. A: Scheduled and PRN (Tylenol--c/o pain) medications given with verbal education and effects monitored. Updated pt on new orders. Support and encouragement provided to pt as needed. Safety checks maintained without self harm gestures. R: Pt compliant with scheduled medications when offered. Denies adverse drug reactions when assessed. Visible in dayroom for groups and was engaged earlier this shift. Pt remains safe on and off unit.

## 2018-05-26 NOTE — Progress Notes (Signed)
Recreation Therapy Notes  Date: 9.24.19 Time: 1000 Location: 500 Hall Dayroom  Group Topic: Wellness  Goal Area(s) Addresses:  Patient will define components of whole wellness. Patient will verbalize benefit of whole wellness.  Behavioral Response: Engaged  Intervention:  Exercise, music  Activity: Exercise.  LRT lead patients in [name] series of stretches before going into the exercises.  Once stretched, each patient had the opportunity to lead the group in an exercise (wall push ups, squats, toe raises, etc).  The group completed 3 rounds of exercises before doing [name] cool down.  Education: Wellness, Building control surveyorDischarge Planning.   Education Outcome: Acknowledges education/In group clarification offered/Needs additional education.   Clinical Observations/Feedback: Pt was active and engaged during group.  Pt was appropriate and on task throughout.  Pt was smiling and engaging with peers during activity.    Caroll RancherMarjette Mujahid [name], LRT/CTRS     Larry AbedLindsay, Larry [name] 05/26/2018 11:28 AM

## 2018-05-27 DIAGNOSIS — Z639 Problem related to primary support group, unspecified: Secondary | ICD-10-CM

## 2018-05-27 DIAGNOSIS — Z590 Homelessness: Secondary | ICD-10-CM

## 2018-05-27 DIAGNOSIS — F3163 Bipolar disorder, current episode mixed, severe, without psychotic features: Principal | ICD-10-CM

## 2018-05-27 DIAGNOSIS — R45850 Homicidal ideations: Secondary | ICD-10-CM

## 2018-05-27 DIAGNOSIS — F1220 Cannabis dependence, uncomplicated: Secondary | ICD-10-CM

## 2018-05-27 DIAGNOSIS — F17210 Nicotine dependence, cigarettes, uncomplicated: Secondary | ICD-10-CM

## 2018-05-27 DIAGNOSIS — R45851 Suicidal ideations: Secondary | ICD-10-CM

## 2018-05-27 DIAGNOSIS — F602 Antisocial personality disorder: Secondary | ICD-10-CM

## 2018-05-27 MED ORDER — TRAZODONE HCL 100 MG PO TABS
100.0000 mg | ORAL_TABLET | Freq: Every day | ORAL | Status: DC
  Administered 2018-05-27 – 2018-05-28 (×2): 100 mg via ORAL
  Filled 2018-05-27 (×4): qty 1

## 2018-05-27 MED ORDER — FLUTICASONE PROPIONATE 50 MCG/ACT NA SUSP
1.0000 | Freq: Two times a day (BID) | NASAL | Status: DC
  Administered 2018-05-27 – 2018-05-29 (×5): 1 via NASAL
  Filled 2018-05-27: qty 16

## 2018-05-27 MED ORDER — PSEUDOEPHEDRINE HCL 30 MG PO TABS: 30.0000 mg | ORAL_TABLET | Freq: Four times a day (QID) | ORAL | Status: DC | PRN

## 2018-05-27 NOTE — Progress Notes (Signed)
Pt agitated after call with parents.  Pt pours water on his room floor and tosses popcorn on his bed and floor. Pt requests something for agitation. Pt given prn medication

## 2018-05-27 NOTE — Tx Team (Signed)
Interdisciplinary Treatment and Diagnostic Plan Update  05/27/2018 Time of Session: 10:37 AM  Muad Noga MRN: [id number]  Principal Diagnosis: Bipolar I, most recent episode mixed, severe ([place])  Secondary Diagnoses: Principal Problem:   Bipolar I, most recent episode mixed, severe ([place]) Active Problems:   Antisocial personality disorder ([place])   Severe cannabis use disorder ([place])   Current Medications:  Current Facility-Administered Medications  Medication Dose Route Frequency Provider Last Rate Last Dose  . acetaminophen (TYLENOL) tablet 650 mg  650 mg Oral Q6H PRN Ethelene Hal, NP   650 mg at 05/26/18 0956  . alum & mag hydroxide-simeth (MAALOX/MYLANTA) 200-200-20 MG/5ML suspension 30 mL  30 mL Oral Q4H PRN Ethelene Hal, NP      . bacitracin ointment   Topical BID Ethelene Hal, NP      . divalproex (DEPAKOTE) DR tablet 500 mg  500 mg Oral Q12H Pennelope Bracken, MD   500 mg at 05/27/18 0755  . hydrOXYzine (ATARAX/VISTARIL) tablet 25 mg  25 mg Oral TID PRN Ethelene Hal, NP   25 mg at 05/25/18 1833  . magnesium hydroxide (MILK OF MAGNESIA) suspension 30 mL  30 mL Oral Daily PRN Ethelene Hal, NP      . nicotine polacrilex (NICORETTE) gum 2 mg  2 mg Oral PRN Pennelope Bracken, MD      . ondansetron [facility]) tablet 4 mg  4 mg Oral Q8H PRN Ethelene Hal, NP      . risperiDONE (RISPERDAL M-TABS) disintegrating tablet 2 mg  2 mg Oral Q8H PRN Pennelope Bracken, MD   2 mg at 05/26/18 2306   Or  . ziprasidone (GEODON) injection 20 mg  20 mg Intramuscular Q12H PRN Maris Berger T, MD      . risperiDONE (RISPERDAL) tablet 1 mg  1 mg Oral BID Pennelope Bracken, MD   1 mg at 05/27/18 0755  . traZODone (DESYREL) tablet 50 mg  50 mg Oral QHS PRN Ethelene Hal, NP   50 mg at 05/26/18 2122    PTA Medications: No medications prior to admission.    Patient Stressors: Arts development officer  issue Marital or family conflict Medication change or noncompliance Substance abuse  Patient Strengths: Ability for insight Average or above average intelligence Capable of independent living General fund of knowledge  Treatment Modalities: Medication Management, Group therapy, Case management,  1 to 1 session with clinician, Psychoeducation, Recreational therapy.   Physician Treatment Plan for Primary Diagnosis: Bipolar I, most recent episode mixed, severe ([place]) Long Term Goal(s): Improvement in symptoms so as ready for discharge  Short Term Goals: Ability to identify and develop effective coping behaviors will improve Ability to demonstrate self-control will improve  Medication Management: Evaluate patient's response, side effects, and tolerance of medication regimen.  Therapeutic Interventions: 1 to 1 sessions, Unit Group sessions and Medication administration.  Evaluation of Outcomes: Progressing  Physician Treatment Plan for Secondary Diagnosis: Principal Problem:   Bipolar I, most recent episode mixed, severe ([place]) Active Problems:   Antisocial personality disorder ([place])   Severe cannabis use disorder ([place])   Long Term Goal(s): Improvement in symptoms so as ready for discharge  Short Term Goals: Ability to identify and develop effective coping behaviors will improve Ability to demonstrate self-control will improve  Medication Management: Evaluate patient's response, side effects, and tolerance of medication regimen.  Therapeutic Interventions: 1 to 1 sessions, Unit Group sessions and Medication administration.  Evaluation of Outcomes: Progressing  RN Treatment Plan for Primary Diagnosis: Bipolar I, most recent episode mixed, severe ([place]) Long Term Goal(s): Knowledge of disease and therapeutic regimen to maintain health will improve  Short Term Goals: Ability to identify and develop effective coping behaviors will improve and Compliance with prescribed  medications will improve  Medication Management: RN will administer medications as ordered by provider, will assess and evaluate patient's response and provide education to patient for prescribed medication. RN will report any adverse and/or side effects to prescribing provider.  Therapeutic Interventions: 1 on 1 counseling sessions, Psychoeducation, Medication administration, Evaluate responses to treatment, Monitor vital signs and CBGs as ordered, Perform/monitor CIWA, COWS, AIMS and Fall Risk screenings as ordered, Perform wound care treatments as ordered.  Evaluation of Outcomes: Progressing   LCSW Treatment Plan for Primary Diagnosis: Bipolar I, most recent episode mixed, severe ([place]) Long Term Goal(s): Safe transition to appropriate next level of care at discharge, Engage patient in therapeutic group addressing interpersonal concerns.  Short Term Goals: Engage patient in aftercare planning with referrals and resources  Therapeutic Interventions: Assess for all discharge needs, 1 to 1 time with Social worker, Explore available resources and support systems, Assess for adequacy in community support network, Educate family and significant other(s) on suicide prevention, Complete Psychosocial Assessment, Interpersonal group therapy.  Evaluation of Outcomes: Met  Return home and follow up [facility]   Progress in Treatment: Attending groups: Yes Participating in groups: Yes Taking medication as prescribed: Yes Toleration medication: Yes, no side effects reported at this time Family/Significant other contact made: No Patient understands diagnosis: Yes AEB asking for help with anger Discussing patient identified problems/goals with staff: Yes Medical problems stabilized or resolved: Yes Denies suicidal/homicidal ideation: Yes Issues/concerns per patient self-inventory: None Other: N/A  New problem(s) identified: None identified at this time.   New Short Term/Long Term Goal(s): Did not  identify goal as he got angry and left the interview.   Discharge Plan or Barriers:   Reason for Continuation of Hospitalization: Mood Instability Anger/Rage Medication stabilization   Estimated Length of Stay: 9/30  Attendees: Patient: Larry [name]-did not sign 05/27/2018  10:37 AM  Physician: Maris Berger, MD 05/27/2018  10:37 AM  Nursing: Sena Hitch, RN 05/27/2018  10:37 AM  RN Care Manager: Lars Pinks, RN 05/27/2018  10:37 AM  Social Worker: Ripley Fraise 05/27/2018  10:37 AM  Recreational Therapist: Winfield Cunas 05/27/2018  10:37 AM  Other: Norberto Sorenson 05/27/2018  10:37 AM  Other:  05/27/2018  10:37 AM    Scribe for Treatment Team:  Roque Lias LCSW 05/27/2018 10:37 AM

## 2018-05-27 NOTE — [facility] Group Notes (Signed)
LCSW Group Therapy Note  05/27/2018 1:15pm  Type of Therapy/Topic:  Group Therapy:  Emotion Regulation  Participation Level:  Did Not Attend   Description of Group:   The purpose of this group is to assist patients in learning to regulate negative emotions and experience positive emotions. Patients will be guided to discuss ways in which they have been vulnerable to their negative emotions. These vulnerabilities will be juxtaposed with experiences of positive emotions or situations, and patients will be challenged to use positive emotions to combat negative ones. Special emphasis will be placed on coping with negative emotions in conflict situations, and patients will process healthy conflict resolution skills.  Therapeutic Goals: 1. Patient will identify two positive emotions or experiences to reflect on in order to balance out negative emotions 2. Patient will label two or more emotions that they find the most difficult to experience 3. Patient will demonstrate positive conflict resolution skills through discussion and/or role plays  Summary of Patient Progress:       Therapeutic Modalities:   Cognitive Behavioral Therapy Feelings Identification Dialectical Behavioral Therapy   Ida Rogue, LCSW 05/27/2018 9:29 AM
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
## 2018-05-27 NOTE — Progress Notes (Signed)
Adult Psychoeducational Group Note  Date:  05/27/2018 Time:  2:28 AM  Group Topic/Focus:  Wrap-Up Group:   The focus of this group is to help patients review their daily goal of treatment and discuss progress on daily workbooks.  Participation Level:  Active  Participation Quality:  Appropriate  Affect:  Appropriate  Cognitive:  Appropriate  Insight: Appropriate  Engagement in Group:  Engaged  Modes of Intervention:  Discussion  Additional Comments:  Pt stated his goal for today was to control his angry outburst. Pt stated he was able to accomplished his goal today. Pt stated his family answering his phone calls today help improve his day. Pt rated his over all day a  7 out of 10.  Felipa Furnace 05/27/2018, 2:28 AM

## 2018-05-27 NOTE — Plan of Care (Signed)
Progress Note  D: pt found in bed; compliant with medication administration. Pt states he slept poorly last night. Pt rates his depression/hopelessness/anxiety a 7/8/7 out of 10 respectively. Pt denies any physical symptoms. Pt rates his physical pain at an 8/10 but failed to state on his self inventory where this pain was. Pt denied any pain to this Clinical research associate. Pt states his gaol for today is to try and not blow up every time he gets up and he will achieve this by trying not to get bad. Pt denies any si/hi/ah/vh and verbally agrees to approach staff if these become apparent or before harming himself while at [facility]. A: pt provided support and encouragement. Pt given medications per protocol and standing orders. Q3m safety checks implemented and continued. R: pt safe on the unit. Will continue to monitor.  Pt progressing in the following metrics  Problem: Education: Goal: Knowledge of [facility] information/materials will improve Outcome: Progressing Goal: Emotional status will improve Outcome: Progressing Goal: Mental status will improve Outcome: Progressing Goal: Verbalization of understanding the information provided will improve Outcome: Progressing   Problem: Activity: Goal: Interest or engagement in activities will improve Outcome: Progressing   Problem: Coping: Goal: Ability to verbalize frustrations and anger appropriately will improve Outcome: Progressing Goal: Ability to demonstrate self-control will improve Outcome: Progressing

## 2018-05-27 NOTE — Progress Notes (Signed)
Recreation Therapy Notes  Date: 9.25.19 Time: 1000 Location: 500 Hall Dayroom  Group Topic: Self-Esteem  Goal Area(s) Addresses:  Patient will successfully identify positive attributes about themselves.  Patient will successfully identify benefit of improved self-esteem.   Behavioral Response: Engaged  Intervention: Markers, colored pencils, worksheet  Activity: Crest of Arms.  Patients were given a crest divided into four parts.  Patients were to come up with four things that make them unique or what sets them apart from everyone else.  Education:  Self-Esteem, Building control surveyor.   Education Outcome: Acknowledges education/In group clarification offered/Needs additional education  Clinical Observations/Feedback: Pt expressed he is nice, caring and likes to share.  Pt was also proud of the fact he hadn't had an outburst today.  Pt stated he wanted to learn to drive and learn to cook on the stove.      Caroll Rancher, LRT/CTRS      Caroll Rancher A 05/27/2018 11:34 AM

## 2018-05-27 NOTE — Progress Notes (Signed)
Pt has been in the bed sleeping most of the evening.  Writer went to wake him for his nighttime meds around 2145.  He reports that he feels fine and was just tired as he did not sleep well last night.  He denies SI/HI/AVH.  He stayed up for a little while after taking his meds and also had a snack, but then returned to bed before 2300.  Pt was pleasant and cooperative.  Support and encouragement offered.  Discharge plans are in process.  Safety maintained with q15 minute checks.
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
## 2018-05-28 NOTE — Plan of Care (Signed)
  Problem: Education: Goal: Emotional status will improve Outcome: Progressing   Problem: Activity: Goal: Interest or engagement in activities will improve Outcome: Progressing   Problem: Safety: Goal: Periods of time without injury will increase Outcome: Progressing  DAR NOTE: Patient presents with sad affect and mood.  Denies suicidal thoughts,pain, auditory and visual hallucinations.  Rates depression at 0, hopelessness at 0, and anxiety at 0.  Maintained on routine safety checks.  Medications given as prescribed.  Support and encouragement offered as needed.  Attended group and participated.  States goal for today is "discharge and go look for work."  Patient observed socializing with peers in the dayroom.  Offered no complaint.

## 2018-05-28 NOTE — Progress Notes (Signed)
Did not attend group 

## 2018-05-28 NOTE — BHH Group Notes (Signed)
BHH Group Notes:  (Nursing/MHT/Case Management/Adjunct)  Date:  05/28/2018  Time:  4:55 PM  Type of Therapy:  Psychoeducational Skills  Participation Level:  Active  Participation Quality:  Appropriate and Attentive  Affect:  Appropriate  Cognitive:  Alert and Appropriate  Insight:  Appropriate and Good  Engagement in Group:  Engaged  Modes of Intervention:  Activity  Summary of Progress/Problems: Patient attended and participated.  Larry [name] 05/28/2018, 4:55 PM

## 2018-05-28 NOTE — BHH Group Notes (Signed)
LCSW Group Therapy Note   05/28/2018 1:15pm   Type of Therapy and Topic:  Group Therapy:  Positive Affirmations   Participation Level:  Active  Description of Group: This group addressed positive affirmation toward self and others. Patients went around the room and identified two positive things about themselves and two positive things about a peer in the room. Patients reflected on how it felt to share something positive with others, to identify positive things about themselves, and to hear positive things from others. Patients were encouraged to have a daily reflection of positive characteristics or circumstances.  Therapeutic Goals 1. Patient will verbalize two of their positive qualities 2. Patient will demonstrate empathy for others by stating two positive qualities about a peer in the group 3. Patient will verbalize their feelings when voicing positive self affirmations and when voicing positive affirmations of others 4. Patients will discuss the potential positive impact on their wellness/recovery of focusing on positive traits of self and others. Summary of Patient Progress:  Stayed the entire time after arriving late, engaged throughout.  "I'm not sure about this relationship I am in.  We have been together for 7 years, and she has not called to check on me or come to see me.  I think I need to find another girl." Mood good.  Appropriately engaged.  Nice job of ignoring others with some form of psychosis going on.  Therapeutic Modalities Cognitive Behavioral Therapy Motivational Interviewing  Ida Rogue, [place] 05/28/2018 2:33 PM

## 2018-05-28 NOTE — Progress Notes (Signed)
Recreation Therapy Notes  Date: 9.26.19 Time: 1000 Location: 500 Hall Dayroom  Group Topic: Communication, Team Building, Problem Solving  Goal Area(s) Addresses:  Patient will effectively work with peer towards shared goal.  Patient will identify skill used to make activity successful.  Patient will identify how skills used during activity can be used to reach post d/c goals.   Intervention: STEM Activity   Activity: In team's, using 20 plastic straws and masking tape, patients were asked to build a bridge that can be suspended between two solid structures (ie: puzzle boxes, two chairs).  The bridge was to be able to hold the weight of a small paperback book.     Education: Pharmacist, community, Building control surveyor.   Education Outcome: Acknowledges education/In group clarification offered/Needs additional education.   Clinical Observations/Feedback: Pt did not attend group.    Caroll Rancher, LRT/CTRS         Caroll Rancher A 05/28/2018 11:37 AM
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
## 2018-05-28 NOTE — [facility] Suicide Risk Assessment (Signed)
[facility] INPATIENT:  Family/Significant Other Suicide Prevention Education  Suicide Prevention Education:  Patient Refusal for Family/Significant Other Suicide Prevention Education: The patient Larry [name] has refused to provide written consent for family/significant other to be provided Family/Significant Other Suicide Prevention Education during admission and/or prior to discharge.  Physician notified.  Ida Rogue 05/28/2018, 3:14 PM

## 2018-05-29 LAB — VALPROIC ACID LEVEL: Valproic Acid Lvl: 42 ug/mL — ABNORMAL LOW (ref 50.0–100.0)

## 2018-05-29 MED ORDER — FLUTICASONE PROPIONATE 50 MCG/ACT NA SUSP: 1.0000 | Freq: Two times a day (BID) | NASAL | 0 refills | Status: DC

## 2018-05-29 MED ORDER — HYDROXYZINE HCL 25 MG PO TABS: 25.0000 mg | ORAL_TABLET | Freq: Three times a day (TID) | ORAL | 0 refills | Status: DC | PRN

## 2018-05-29 MED ORDER — TRAZODONE HCL 100 MG PO TABS: 100.0000 mg | ORAL_TABLET | Freq: Every day | ORAL | 0 refills | Status: DC

## 2018-05-29 MED ORDER — DIVALPROEX SODIUM 500 MG PO DR TAB: 500.0000 mg | DELAYED_RELEASE_TABLET | Freq: Two times a day (BID) | ORAL | 0 refills | Status: DC

## 2018-05-29 MED ORDER — BACITRACIN ZINC 500 UNIT/GM EX OINT: TOPICAL_OINTMENT | Freq: Two times a day (BID) | CUTANEOUS | 0 refills | Status: DC

## 2018-05-29 MED ORDER — NICOTINE POLACRILEX 2 MG MT GUM: 2.0000 mg | CHEWING_GUM | OROMUCOSAL | 0 refills | Status: DC | PRN

## 2018-05-29 NOTE — Progress Notes (Addendum)
Recreation Therapy Notes  Date: 9.27.19 Time: 1000 Location: 500 Hall Dayroom  Group Topic: Goals  Goal Area(s) Addresses:  Patient will verbalize the importance of setting goals. Patient will be able to identify the benefit of having goals post d/c.  Behavioral Response: Engaged  Intervention: Worksheet, pencils  Activity :  Goal Setting.  Patients were to set goals for what they wanted to accomplish within the next week, the next month, within a year and within five years.  Patients were to then identify any obstacles that would make it challenging to complete their goal, strategies they needed to complete their goals and what they could start doing tomorrow to work towards goals.  Education:  Goals, Discharge Planning.   Education Outcome: Acknowledges edcuation/In group clarification offered/Needs additional education  Clinical Observations/Feedback: Pt was engaged and focused.  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  Pt didn't identify any obstacles.  Pt expressed he needed to stay positive and start doing research to find work.    Caroll Rancher, LRT/CTRS     Caroll Rancher A 05/29/2018 12:06 PM

## 2018-05-29 NOTE — Progress Notes (Signed)
Patient discharged to lobby. Patient was stable and appreciative at that time. All papers and prescriptions were given and valuables returned. Verbal understanding expressed. Denies SI/HI and A/VH. Patient given opportunity to express concerns and ask questions.  
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
## 2018-05-29 NOTE — Progress Notes (Signed)
Recreation Therapy Notes  INPATIENT RECREATION TR PLAN  Patient Details Name: Larry [name] MRN: [id number] DOB: 18-May-1996 Today's Date: 05/29/2018  Rec Therapy Plan Is patient appropriate for Therapeutic Recreation?: Yes Treatment times per week: about 3 days Estimated Length of Stay: 5-7 days TR Treatment/Interventions: Group participation ([name])  Discharge Criteria Pt will be discharged from therapy if:: Discharged Treatment plan/goals/alternatives discussed and agreed upon by:: Patient/family  Discharge Summary Short term goals set: See patient care plan Short term goals met: Complete Progress toward goals comments: Groups attended Which groups?: Self-esteem, Wellness, Goal setting Reason goals not met: None Therapeutic equipment acquired: N/A Reason patient discharged from therapy: Discharge from hospital Pt/family agrees with progress & goals achieved: Yes Date patient discharged from therapy: 05/29/18    Larry [name], LRT/CTRS   Larry [name], Orly Quimby A 05/29/2018, 10:44 AM

## 2018-05-29 NOTE — Progress Notes (Signed)
  [facility] Adult Case Management Discharge Plan :  Will you be returning to the same living situation after discharge:  Yes,  home At discharge, do you have transportation home?: Yes,  cab Do you have the ability to pay for your medications: Yes,  mental health  Release of information consent forms completed and in the chart;  Patient's signature needed at discharge.  Patient to Follow up at: Follow-up Information    [facility] Follow up on 06/01/2018.   Why:  Monday at 8AM with dorothy Contact information: [address] [place] [address] [phone]           Next level of care provider has access to [facility] Link:no  Safety Planning and Suicide Prevention discussed: Yes,  yes  Have you used any form of tobacco in the last 30 days? (Cigarettes, Smokeless Tobacco, Cigars, and/or Pipes): Yes  Has patient been referred to the Quitline?: Patient refused referral  Patient has been referred for addiction treatment: Pt. refused referral  Ida Rogue, LCSW 05/29/2018, 9:42 AM

## 2018-05-29 NOTE — Plan of Care (Signed)
Pt was able to identify coping strategies by completion of recreation therapy group sessions.   Caroll Rancher, LRT/CTRS

## 2020-05-04 ENCOUNTER — Other Ambulatory Visit: Payer: Self-pay

## 2020-05-04 ENCOUNTER — Ambulatory Visit (HOSPITAL_COMMUNITY)
Admission: EM | Admit: 2020-05-04 | Discharge: 2020-05-04 | Disposition: A | Discharge status: Home / Self Care | Payer: PRIVATE HEALTH INSURANCE

## 2020-05-04 DIAGNOSIS — F310 Bipolar disorder, current episode hypomanic: Secondary | ICD-10-CM | POA: Diagnosis not present

## 2020-05-04 DIAGNOSIS — F1220 Cannabis dependence, uncomplicated: Secondary | ICD-10-CM
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
## 2020-05-04 NOTE — Discharge Instructions (Addendum)
Please come to [facility] on 9/7 between 8 and 10 am for a walk in therapy appointment.   Take all of your medications as prescribed by your mental healthcare provider.  Report any adverse effects and reactions from your medications to your outpatient provider promptly.  Do not engage in alcohol and or illegal drug use while on prescription medicines. Keep all scheduled appointments. This is to ensure that you are getting refills on time and to avoid any interruption in your medication.  If you are unable to keep an appointment call to reschedule.  Be sure to follow up with resources and follow ups given. In the event of worsening symptoms call the crisis hotline, 911, and or go to the nearest emergency department for appropriate evaluation and treatment of symptoms. Follow-up with your primary care provider for your medical issues, concerns and or health care needs.
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
## 2020-05-10 ENCOUNTER — Ambulatory Visit (INDEPENDENT_AMBULATORY_CARE_PROVIDER_SITE_OTHER): Payer: No Typology Code available for payment source | Admitting: Clinical

## 2020-05-10 ENCOUNTER — Other Ambulatory Visit: Payer: Self-pay

## 2020-05-10 DIAGNOSIS — F331 Major depressive disorder, recurrent, moderate: Secondary | ICD-10-CM | POA: Diagnosis not present
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
## 2020-08-27 ENCOUNTER — Ambulatory Visit (HOSPITAL_COMMUNITY)
Admission: EM | Admit: 2020-08-27 | Discharge: 2020-08-28 | Disposition: A | Discharge status: Home / Self Care | Payer: No Typology Code available for payment source | Attending: Family | Admitting: Family

## 2020-08-27 ENCOUNTER — Other Ambulatory Visit: Payer: Self-pay

## 2020-08-27 DIAGNOSIS — Z9151 Personal history of suicidal behavior: Secondary | ICD-10-CM | POA: Diagnosis not present

## 2020-08-27 DIAGNOSIS — Z8659 Personal history of other mental and behavioral disorders: Secondary | ICD-10-CM | POA: Diagnosis not present

## 2020-08-27 DIAGNOSIS — F17210 Nicotine dependence, cigarettes, uncomplicated: Secondary | ICD-10-CM | POA: Insufficient documentation

## 2020-08-27 DIAGNOSIS — R45850 Homicidal ideations: Secondary | ICD-10-CM | POA: Diagnosis not present

## 2020-08-27 DIAGNOSIS — Z20822 Contact with and (suspected) exposure to covid-19: Secondary | ICD-10-CM | POA: Insufficient documentation

## 2020-08-27 DIAGNOSIS — R45851 Suicidal ideations: Secondary | ICD-10-CM | POA: Insufficient documentation

## 2020-08-27 DIAGNOSIS — Z881 Allergy status to other antibiotic agents status: Secondary | ICD-10-CM | POA: Insufficient documentation

## 2020-08-27 DIAGNOSIS — F3130 Bipolar disorder, current episode depressed, mild or moderate severity, unspecified: Secondary | ICD-10-CM

## 2020-08-27 DIAGNOSIS — F319 Bipolar disorder, unspecified: Secondary | ICD-10-CM

## 2020-08-27 DIAGNOSIS — F1290 Cannabis use, unspecified, uncomplicated: Secondary | ICD-10-CM | POA: Insufficient documentation

## 2020-08-27 LAB — RESP PANEL BY RT-PCR (FLU A&B, COVID) ARPGX2
Influenza A by PCR: NEGATIVE
Influenza B by PCR: NEGATIVE
SARS Coronavirus 2 by RT PCR: NEGATIVE

## 2020-08-27 LAB — POC SARS CORONAVIRUS 2 AG: SARS Coronavirus 2 Ag: NEGATIVE

## 2020-08-27 MED ORDER — TRAZODONE HCL 150 MG PO TABS: 150.0000 mg | ORAL_TABLET | Freq: Once | ORAL | Status: DC

## 2020-08-27 MED ORDER — NICOTINE 21 MG/24HR TD PT24
21.0000 mg | MEDICATED_PATCH | Freq: Every day | TRANSDERMAL | Status: DC
  Filled 2020-08-27: qty 1

## 2020-08-27 MED ORDER — ALUM & MAG HYDROXIDE-SIMETH 200-200-20 MG/5ML PO SUSP: 30.0000 mL | ORAL | Status: DC | PRN

## 2020-08-27 MED ORDER — ACETAMINOPHEN 325 MG PO TABS: 650.0000 mg | ORAL_TABLET | Freq: Four times a day (QID) | ORAL | Status: DC | PRN

## 2020-08-27 MED ORDER — TRAZODONE HCL 150 MG PO TABS
150.0000 mg | ORAL_TABLET | Freq: Once | ORAL | Status: AC
  Administered 2020-08-27: 150 mg via ORAL
  Filled 2020-08-27: qty 1

## 2020-08-27 MED ORDER — CARBAMAZEPINE ER 100 MG PO TB12
100.0000 mg | ORAL_TABLET | Freq: Two times a day (BID) | ORAL | Status: DC
  Administered 2020-08-27 – 2020-08-28 (×3): 100 mg via ORAL
  Filled 2020-08-27 (×3): qty 1

## 2020-08-27 MED ORDER — LORAZEPAM 0.5 MG PO TABS
0.5000 mg | ORAL_TABLET | Freq: Four times a day (QID) | ORAL | Status: DC | PRN
  Administered 2020-08-27: 0.5 mg via ORAL
  Filled 2020-08-27: qty 1

## 2020-08-27 MED ORDER — MAGNESIUM HYDROXIDE 400 MG/5ML PO SUSP: 30.0000 mL | Freq: Every day | ORAL | Status: DC | PRN
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
## 2020-08-27 NOTE — ED Notes (Signed)
Offered patient meal. Refused for now

## 2020-08-27 NOTE — Discharge Instructions (Signed)
Take all medications as prescribed. Keep all follow-up appointments as scheduled.  Do not consume alcohol or use illegal drugs while on prescription medications. Report any adverse effects from your medications to your primary care provider promptly.  In the event of recurrent symptoms or worsening symptoms, call 911, a crisis hotline, or go to the nearest emergency department for evaluation.   
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
## 2020-08-27 NOTE — ED Notes (Signed)
Pt is awake and laying in his bed. He calm and cooperative. Has no c/o of pain or distress. Will continue to monitor for safety
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
## 2020-08-27 NOTE — ED Notes (Signed)
Pt alert and oriented during admission at [facility]. Pt denies SI/HI, A/VH, and any pain, and he is cooperative. Education, support, reassurance, and encouragement provided. Pt's belongings in locker. Pt denies any concerns at this time, and verbally contracts for safety. Pt ambulating on the unit with no issues. Pt remains safe on the unit.

## 2020-08-27 NOTE — ED Notes (Signed)
Locker #[id number]  

## 2020-08-27 NOTE — ED Notes (Signed)
Pt is resting @this  time. He is tossing and turning in bed. Has no c/o of pain or distress. Will continue to monitor for safety

## 2020-08-28 MED ORDER — CARBAMAZEPINE ER 100 MG PO TB12: 100.0000 mg | ORAL_TABLET | Freq: Two times a day (BID) | ORAL | 1 refills | Status: DC
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
## 2020-08-28 NOTE — ED Notes (Signed)
Patient discharged home. AVS/Follow-up/Prescriptions reviewed with patient and he verbalized understanding. Written copies given to patient. All patient belongings returned to him. Patient escorted off unit by staff. Mom picked patient up.

## 2020-08-28 NOTE — ED Notes (Signed)
Patient received juice for breakast by request

## 2020-08-28 NOTE — ED Notes (Signed)
Pt sleeping at present, no distress noted, monitoring for safety. 

## 2020-08-28 NOTE — ED Notes (Signed)
Patient alert and oriented. Patient denies SI/HI. Patient given support and encouragement. Monitoring continues.

## 2020-08-28 NOTE — ED Notes (Signed)
Pt sleeping. No distress observed. Safety maintained and will continue to monitor.  °

## 2020-11-23 ENCOUNTER — Ambulatory Visit (HOSPITAL_COMMUNITY)
Admission: EM | Admit: 2020-11-23 | Discharge: 2020-11-24 | Disposition: A | Discharge status: Home / Self Care | Payer: PRIVATE HEALTH INSURANCE | Attending: Nurse Practitioner | Admitting: Nurse Practitioner

## 2020-11-23 ENCOUNTER — Other Ambulatory Visit: Payer: Self-pay

## 2020-11-23 ENCOUNTER — Emergency Department (HOSPITAL_COMMUNITY)
Admission: EM | Admit: 2020-11-23 | Discharge: 2020-11-23 | Disposition: A | Discharge status: Home / Self Care | Payer: PRIVATE HEALTH INSURANCE | Attending: Emergency Medicine | Admitting: Emergency Medicine

## 2020-11-23 DIAGNOSIS — F12288 Cannabis dependence with other cannabis-induced disorder: Secondary | ICD-10-CM | POA: Insufficient documentation

## 2020-11-23 DIAGNOSIS — F1220 Cannabis dependence, uncomplicated: Secondary | ICD-10-CM

## 2020-11-23 DIAGNOSIS — Z20822 Contact with and (suspected) exposure to covid-19: Secondary | ICD-10-CM | POA: Insufficient documentation

## 2020-11-23 DIAGNOSIS — F1924 Other psychoactive substance dependence with psychoactive substance-induced mood disorder: Secondary | ICD-10-CM | POA: Insufficient documentation

## 2020-11-23 DIAGNOSIS — T421X6A Underdosing of iminostilbenes, initial encounter: Secondary | ICD-10-CM | POA: Diagnosis not present

## 2020-11-23 DIAGNOSIS — Z046 Encounter for general psychiatric examination, requested by authority: Secondary | ICD-10-CM | POA: Insufficient documentation

## 2020-11-23 DIAGNOSIS — F17210 Nicotine dependence, cigarettes, uncomplicated: Secondary | ICD-10-CM | POA: Diagnosis not present

## 2020-11-23 DIAGNOSIS — R451 Restlessness and agitation: Secondary | ICD-10-CM | POA: Diagnosis present

## 2020-11-23 DIAGNOSIS — Z5321 Procedure and treatment not carried out due to patient leaving prior to being seen by health care provider: Secondary | ICD-10-CM | POA: Diagnosis not present

## 2020-11-23 DIAGNOSIS — Z91128 Patient's intentional underdosing of medication regimen for other reason: Secondary | ICD-10-CM | POA: Diagnosis not present

## 2020-11-23 DIAGNOSIS — F12988 Cannabis use, unspecified with other cannabis-induced disorder: Secondary | ICD-10-CM

## 2020-11-23 DIAGNOSIS — F39 Unspecified mood [affective] disorder: Secondary | ICD-10-CM

## 2020-11-23 LAB — POC SARS CORONAVIRUS 2 AG -  ED: SARS Coronavirus 2 Ag: NEGATIVE

## 2020-11-23 LAB — POCT URINE DRUG SCREEN - MANUAL ENTRY (I-SCREEN)
POC Amphetamine UR: NOT DETECTED
POC Buprenorphine (BUP): NOT DETECTED
POC Cocaine UR: NOT DETECTED
POC Marijuana UR: POSITIVE — AB
POC Methadone UR: NOT DETECTED
POC Methamphetamine UR: NOT DETECTED
POC Morphine: NOT DETECTED
POC Oxazepam (BZO): NOT DETECTED
POC Oxycodone UR: NOT DETECTED
POC Secobarbital (BAR): NOT DETECTED

## 2020-11-23 MED ORDER — HYDROXYZINE HCL 25 MG PO TABS
25.0000 mg | ORAL_TABLET | Freq: Three times a day (TID) | ORAL | Status: DC | PRN
  Administered 2020-11-23: 25 mg via ORAL
  Filled 2020-11-23: qty 1

## 2020-11-23 MED ORDER — ALUM & MAG HYDROXIDE-SIMETH 200-200-20 MG/5ML PO SUSP: 30.0000 mL | ORAL | Status: DC | PRN

## 2020-11-23 MED ORDER — ACETAMINOPHEN 325 MG PO TABS: 650.0000 mg | ORAL_TABLET | Freq: Four times a day (QID) | ORAL | Status: DC | PRN

## 2020-11-23 MED ORDER — LORAZEPAM 1 MG PO TABS
1.0000 mg | ORAL_TABLET | Freq: Once | ORAL | Status: AC
  Administered 2020-11-23: 1 mg via ORAL
  Filled 2020-11-23: qty 1

## 2020-11-23 MED ORDER — TRAZODONE HCL 50 MG PO TABS
50.0000 mg | ORAL_TABLET | Freq: Every evening | ORAL | Status: DC | PRN
  Administered 2020-11-23: 50 mg via ORAL
  Filled 2020-11-23: qty 1

## 2020-11-23 MED ORDER — MAGNESIUM HYDROXIDE 400 MG/5ML PO SUSP: 30.0000 mL | Freq: Every day | ORAL | Status: DC | PRN

## 2020-11-23 MED ORDER — OLANZAPINE 10 MG PO TBDP
10.0000 mg | ORAL_TABLET | Freq: Once | ORAL | Status: AC
  Administered 2020-11-23: 10 mg via ORAL
  Filled 2020-11-23: qty 1
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
## 2020-11-23 NOTE — ED Triage Notes (Signed)
Pt states "theres just a lot of stuff going on right now". When this RN prompted pt to give more info, pt states he is not suicidal, not homicidal. He reports he flushed all his prescribed meds "I flushed all my meds they told me it can cause seizures"  Pt refuses to stay any longer. Encouraged pt to stay and be seen by provider. Pt did not come with any paperwork. Ambulatory and left.

## 2020-11-23 NOTE — ED Notes (Signed)
Pt A&O x 4,  Presents yelling and screaming that no one  Will help him, no one listens to him and people are laughing  At him.  Denies SI.  Pt HI towards girlfriend, admits to hallucinations.  Skin search completed, calm & cooperative at present, no distress noted,  Monitoring for safety.
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
## 2020-11-24 DIAGNOSIS — F12988 Cannabis use, unspecified with other cannabis-induced disorder: Secondary | ICD-10-CM | POA: Diagnosis not present

## 2020-11-24 DIAGNOSIS — F1220 Cannabis dependence, uncomplicated: Secondary | ICD-10-CM

## 2020-11-24 DIAGNOSIS — F39 Unspecified mood [affective] disorder: Secondary | ICD-10-CM

## 2020-11-24 LAB — COMPREHENSIVE METABOLIC PANEL
ALT: 13 U/L (ref 0–44)
AST: 15 U/L (ref 15–41)
Albumin: 4.7 g/dL (ref 3.5–5.0)
Alkaline Phosphatase: 63 U/L (ref 38–126)
Anion gap: 9 (ref 5–15)
BUN: 12 mg/dL (ref 6–20)
CO2: 27 mmol/L (ref 22–32)
Calcium: 9.7 mg/dL (ref 8.9–10.3)
Chloride: 104 mmol/L (ref 98–111)
Creatinine, Ser: 1 mg/dL (ref 0.61–1.24)
GFR, Estimated: >60 mL/min (ref >60)
Glucose, Bld: 78 mg/dL (ref 70–99)
Potassium: 3.7 mmol/L (ref 3.5–5.1)
Sodium: 140 mmol/L (ref 135–145)
Total Bilirubin: 1.6 mg/dL — ABNORMAL HIGH (ref 0.3–1.2)
Total Protein: 7.5 g/dL (ref 6.5–8.1)

## 2020-11-24 LAB — CBC WITH DIFFERENTIAL/PLATELET
Abs Immature Granulocytes: 0.01 10*3/uL (ref 0.00–0.07)
Basophils Absolute: 0.1 10*3/uL (ref 0.0–0.1)
Basophils Relative: 1 %
Eosinophils Absolute: 0.1 10*3/uL (ref 0.0–0.5)
Eosinophils Relative: 1 %
HCT: 47.5 % (ref 39.0–52.0)
Hemoglobin: 16.8 g/dL (ref 13.0–17.0)
Immature Granulocytes: 0 %
Lymphocytes Relative: 25 %
Lymphs Abs: 1.7 10*3/uL (ref 0.7–4.0)
MCH: 30.4 pg (ref 26.0–34.0)
MCHC: 35.4 g/dL (ref 30.0–36.0)
MCV: 86.1 fL (ref 80.0–100.0)
Monocytes Absolute: 0.6 10*3/uL (ref 0.1–1.0)
Monocytes Relative: 9 %
Neutro Abs: 4.4 10*3/uL (ref 1.7–7.7)
Neutrophils Relative %: 64 %
Platelets: 166 10*3/uL (ref 150–400)
RBC: 5.52 MIL/uL (ref 4.22–5.81)
RDW: 13.6 % (ref 11.5–15.5)
WBC: 6.8 10*3/uL (ref 4.0–10.5)
nRBC: 0 % (ref 0.0–0.2)

## 2020-11-24 LAB — HEMOGLOBIN A1C
Hgb A1c MFr Bld: 5.4 % (ref 4.8–5.6)
Mean Plasma Glucose: 108.28 mg/dL

## 2020-11-24 LAB — LIPID PANEL
Cholesterol: 131 mg/dL (ref 0–200)
HDL: 34 mg/dL — ABNORMAL LOW (ref >40)
LDL Cholesterol: 85 mg/dL (ref 0–99)
Total CHOL/HDL Ratio: 3.9 RATIO
Triglycerides: 58 mg/dL (ref <150)
VLDL: 12 mg/dL (ref 0–40)

## 2020-11-24 LAB — RESP PANEL BY RT-PCR (FLU A&B, COVID) ARPGX2
Influenza A by PCR: NEGATIVE
Influenza B by PCR: NEGATIVE
SARS Coronavirus 2 by RT PCR: NEGATIVE

## 2020-11-24 LAB — TSH: TSH: 2.097 u[IU]/mL (ref 0.350–4.500)

## 2020-11-24 MED ORDER — OLANZAPINE 10 MG PO TABS: 10.0000 mg | ORAL_TABLET | Freq: Every day | ORAL | Status: DC

## 2020-11-24 NOTE — ED Notes (Signed)
Pt alert and oriented on the unit. Education, support, and encouragement provided. Discharge summary, medications and follow up appointments reviewed with pt. Suicide prevention resources provided. Pt's belongings in locker returned and belongings sheet signed. Pt denies SI/HI, A/VH, pain, or any concerns at this time. Pt ambulatory on and off unit. Pt discharged to lobby. 

## 2020-11-24 NOTE — Discharge Instructions (Signed)
Take all medications as prescribed. Keep all follow-up appointments as scheduled.  Do not consume alcohol or use illegal drugs while on prescription medications. Report any adverse effects from your medications to your primary care provider promptly.  In the event of recurrent symptoms or worsening symptoms, call 911, a crisis hotline, or go to the nearest emergency department for evaluation.   
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
## 2020-11-24 NOTE — ED Notes (Signed)
Pt given breakfast.

## 2020-11-24 NOTE — ED Notes (Signed)
Pt asleep in bed. Respirations even and unlabored. Will continue to monitor for safety. ?

## 2020-11-27 NOTE — BH Assessment (Signed)
Care Management - Follow Up [facility] Discharges   Writer attempted to make contact with patient today and was unsuccessful.  Writer was able to leave a HIPPA compliant voice message and will await callback.   

## 2021-06-26 ENCOUNTER — Encounter (HOSPITAL_COMMUNITY): Payer: Self-pay

## 2021-06-26 ENCOUNTER — Emergency Department (HOSPITAL_COMMUNITY)
Admission: EM | Admit: 2021-06-26 | Discharge: 2021-06-26 | Disposition: A | Discharge status: Home / Self Care | Payer: No Typology Code available for payment source | Attending: Emergency Medicine | Admitting: Emergency Medicine

## 2021-06-26 ENCOUNTER — Other Ambulatory Visit: Payer: Self-pay

## 2021-06-26 DIAGNOSIS — Z5321 Procedure and treatment not carried out due to patient leaving prior to being seen by health care provider: Secondary | ICD-10-CM | POA: Diagnosis not present

## 2021-06-26 DIAGNOSIS — Z1330 Encounter for screening examination for mental health and behavioral disorders, unspecified: Secondary | ICD-10-CM | POA: Diagnosis present

## 2021-06-26 NOTE — ED Triage Notes (Signed)
Pt states that he just needs resources. He states that he just got out of jail and needs help. Pt is not SI or HI.

## 2021-11-15 ENCOUNTER — Other Ambulatory Visit: Payer: Self-pay

## 2021-11-15 ENCOUNTER — Encounter (HOSPITAL_COMMUNITY): Payer: Self-pay | Admitting: Emergency Medicine

## 2021-11-15 ENCOUNTER — Emergency Department (HOSPITAL_COMMUNITY)
Admission: EM | Admit: 2021-11-15 | Discharge: 2021-11-16 | Disposition: A | Discharge status: Other Acute Inpatient Hospital | Payer: PRIVATE HEALTH INSURANCE | Attending: Emergency Medicine | Admitting: Emergency Medicine

## 2021-11-15 DIAGNOSIS — R45851 Suicidal ideations: Secondary | ICD-10-CM | POA: Diagnosis not present

## 2021-11-15 DIAGNOSIS — Z20822 Contact with and (suspected) exposure to covid-19: Secondary | ICD-10-CM | POA: Diagnosis not present

## 2021-11-15 DIAGNOSIS — F3130 Bipolar disorder, current episode depressed, mild or moderate severity, unspecified: Secondary | ICD-10-CM | POA: Insufficient documentation

## 2021-11-15 DIAGNOSIS — F22 Delusional disorders: Secondary | ICD-10-CM | POA: Diagnosis not present

## 2021-11-15 DIAGNOSIS — Z046 Encounter for general psychiatric examination, requested by authority: Secondary | ICD-10-CM | POA: Diagnosis present

## 2021-11-15 HISTORY — DX: Bipolar disorder, unspecified: F31.9

## 2021-11-15 LAB — RESP PANEL BY RT-PCR (FLU A&B, COVID) ARPGX2
Influenza A by PCR: NEGATIVE
Influenza B by PCR: NEGATIVE
SARS Coronavirus 2 by RT PCR: NEGATIVE

## 2021-11-15 LAB — CBC WITH DIFFERENTIAL/PLATELET
Abs Immature Granulocytes: 0.01 10*3/uL (ref 0.00–0.07)
Basophils Absolute: 0 10*3/uL (ref 0.0–0.1)
Basophils Relative: 1 %
Eosinophils Absolute: 0.1 10*3/uL (ref 0.0–0.5)
Eosinophils Relative: 1 %
HCT: 52.3 % — ABNORMAL HIGH (ref 39.0–52.0)
Hemoglobin: 18 g/dL — ABNORMAL HIGH (ref 13.0–17.0)
Immature Granulocytes: 0 %
Lymphocytes Relative: 37 %
Lymphs Abs: 2.4 10*3/uL (ref 0.7–4.0)
MCH: 30.1 pg (ref 26.0–34.0)
MCHC: 34.4 g/dL (ref 30.0–36.0)
MCV: 87.3 fL (ref 80.0–100.0)
Monocytes Absolute: 0.4 10*3/uL (ref 0.1–1.0)
Monocytes Relative: 7 %
Neutro Abs: 3.4 10*3/uL (ref 1.7–7.7)
Neutrophils Relative %: 54 %
Platelets: 161 10*3/uL (ref 150–400)
RBC: 5.99 MIL/uL — ABNORMAL HIGH (ref 4.22–5.81)
RDW: 13.6 % (ref 11.5–15.5)
WBC: 6.4 10*3/uL (ref 4.0–10.5)
nRBC: 0 % (ref 0.0–0.2)

## 2021-11-15 LAB — COMPREHENSIVE METABOLIC PANEL
ALT: 16 U/L (ref 0–44)
AST: 21 U/L (ref 15–41)
Albumin: 4.8 g/dL (ref 3.5–5.0)
Alkaline Phosphatase: 64 U/L (ref 38–126)
Anion gap: 9 (ref 5–15)
BUN: 14 mg/dL (ref 6–20)
CO2: 22 mmol/L (ref 22–32)
Calcium: 9.4 mg/dL (ref 8.9–10.3)
Chloride: 108 mmol/L (ref 98–111)
Creatinine, Ser: 0.97 mg/dL (ref 0.61–1.24)
GFR, Estimated: >60 mL/min (ref >60)
Glucose, Bld: 124 mg/dL — ABNORMAL HIGH (ref 70–99)
Potassium: 3.3 mmol/L — ABNORMAL LOW (ref 3.5–5.1)
Sodium: 139 mmol/L (ref 135–145)
Total Bilirubin: 1.1 mg/dL (ref 0.3–1.2)
Total Protein: 8.5 g/dL — ABNORMAL HIGH (ref 6.5–8.1)

## 2021-11-15 LAB — RAPID URINE DRUG SCREEN, HOSP PERFORMED
Amphetamines: NOT DETECTED
Barbiturates: NOT DETECTED
Benzodiazepines: NOT DETECTED
Cocaine: NOT DETECTED
Opiates: NOT DETECTED
Tetrahydrocannabinol: POSITIVE — AB

## 2021-11-15 LAB — ETHANOL: Alcohol, Ethyl (B): <10 mg/dL (ref <10)

## 2021-11-15 LAB — SALICYLATE LEVEL: Salicylate Lvl: <7 mg/dL — ABNORMAL LOW (ref 7.0–30.0)

## 2021-11-15 LAB — ACETAMINOPHEN LEVEL: Acetaminophen (Tylenol), Serum: <10 ug/mL — ABNORMAL LOW (ref 10–30)

## 2021-11-15 MED ORDER — NICOTINE 14 MG/24HR TD PT24: 14.0000 mg | MEDICATED_PATCH | Freq: Every day | TRANSDERMAL | Status: DC | PRN

## 2021-11-15 MED ORDER — DROPERIDOL 2.5 MG/ML IJ SOLN
5.0000 mg | Freq: Once | INTRAMUSCULAR | Status: AC | PRN
  Administered 2021-11-15: 5 mg via INTRAMUSCULAR
  Filled 2021-11-15: qty 2

## 2021-11-15 MED ORDER — LORAZEPAM 2 MG/ML IJ SOLN
2.0000 mg | Freq: Once | INTRAMUSCULAR | Status: AC | PRN
  Administered 2021-11-15: 2 mg via INTRAMUSCULAR
  Filled 2021-11-15: qty 1

## 2021-11-15 MED ORDER — IBUPROFEN 200 MG PO TABS: 600.0000 mg | ORAL_TABLET | Freq: Three times a day (TID) | ORAL | Status: DC | PRN

## 2021-11-15 MED ORDER — ONDANSETRON HCL 4 MG PO TABS: 4.0000 mg | ORAL_TABLET | Freq: Three times a day (TID) | ORAL | Status: DC | PRN
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
## 2021-11-15 NOTE — ED Notes (Signed)
Pt is sleeping and was cooperative while tech was getting temperature, pt asked for a pillow and tech provided one.   ?
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
## 2021-11-15 NOTE — ED Notes (Signed)
IVC paperwork faxed to [phone] ?

## 2021-11-15 NOTE — ED Notes (Addendum)
Unable to obtain temp due to pt aggressive behavior  ?

## 2021-11-15 NOTE — ED Provider Notes (Signed)
?[facility]-EMERGENCY DEPT ?Provider Note ? ? ?CSN: [id number] ?Arrival date & time: 11/15/21  1313 ? ?  ? ?History ? ?Chief Complaint  ?Patient presents with  ? Suicidal  ? Suicide Attempt  ? Aggressive Behavior  ? ? ?Larry Puma. is a 26 y.o. male with hx of bipolar disorder presenting to ED with aggressive behavior and concern for SI.  Patient is extremely agitated on arrival, screaming at staff.  His mother reports that he is suicidal, talking about wanting to kill himself, trying to jump out of the car.  She does not know if he's been off his medications.  She reports he is getting continuous altercations with his girlfriend, and recently had another big blowout.  Mother believes he is at risk for harming himself or someone else. ? ? ? ?HPI ? ?  ? ?Home Medications ?Prior to Admission medications   ?Medication Sig Start Date End Date Taking? Authorizing Provider  ?carbamazepine (TEGRETOL XR) 100 MG 12 hr tablet Take 1 tablet (100 mg total) by mouth 2 (two) times daily. ?Patient not taking: No sig reported 08/28/20   Money, Gerlene Burdock, FNP  ?   ? ?Allergies    ?Zithromax [azithromycin dihydrate]   ? ?Review of Systems   ?Review of Systems ? ?Physical Exam ?Updated Vital Signs ?BP (!) 139/107 (BP Location: Left Arm)   Pulse (!) 115   Resp 20   SpO2 100%  ?Physical Exam ?Constitutional:   ?   Comments: Agitated, restless, screaming  ?HENT:  ?   Head: Normocephalic and atraumatic.  ?Eyes:  ?   Conjunctiva/sclera: Conjunctivae normal.  ?   Pupils: Pupils are equal, round, and reactive to light.  ?Cardiovascular:  ?   Rate and Rhythm: Normal rate and regular rhythm.  ?Pulmonary:  ?   Effort: Pulmonary effort is normal. No respiratory distress.  ?Abdominal:  ?   General: There is no distension.  ?   Tenderness: There is no abdominal tenderness.  ?Skin: ?   General: Skin is warm and dry.  ?Neurological:  ?   General: No focal deficit present.  ?   Mental Status: He is alert and oriented to  person, place, and time. Mental status is at baseline.  ?Psychiatric:  ?   Comments: Patient is not redirectable on exam  ? ? ?ED Results / Procedures / Treatments   ?Labs ?(all labs ordered are listed, but only abnormal results are displayed) ?Labs Reviewed  ?COMPREHENSIVE METABOLIC PANEL - Abnormal; Notable for the following components:  ?    Result Value  ? Potassium 3.3 (*)   ? Glucose, Bld 124 (*)   ? Total Protein 8.5 (*)   ? All other components within normal limits  ?CBC WITH DIFFERENTIAL/PLATELET - Abnormal; Notable for the following components:  ? RBC 5.99 (*)   ? Hemoglobin 18.0 (*)   ? HCT 52.3 (*)   ? All other components within normal limits  ?SALICYLATE LEVEL - Abnormal; Notable for the following components:  ? Salicylate Lvl <7.0 (*)   ? All other components within normal limits  ?ACETAMINOPHEN LEVEL - Abnormal; Notable for the following components:  ? Acetaminophen (Tylenol), Serum <10 (*)   ? All other components within normal limits  ?RESP PANEL BY RT-PCR (FLU A&B, COVID) ARPGX2  ?ETHANOL  ?RAPID URINE DRUG SCREEN, HOSP PERFORMED  ?RAPID URINE DRUG SCREEN, HOSP PERFORMED  ? ? ?EKG ?None ? ?Radiology ?No results found. ? ?Procedures ?Procedures  ? ? ?Medications  Ordered in ED ?Medications  ?droperidol (INAPSINE) 2.5 MG/ML injection 5 mg (has no administration in time range)  ?LORazepam (ATIVAN) injection 2 mg (has no administration in time range)  ?ibuprofen (ADVIL) tablet 600 mg (has no administration in time range)  ?ondansetron (ZOFRAN) tablet 4 mg (has no administration in time range)  ? ? ?ED Course/ Medical Decision Making/ A&P ?Clinical Course as of 11/15/21 1520  ?Thu Nov 15, 2021  ?1344 Intramuscular sedation medications ordered as the patient is extremely agitated does not appear willing or cooperative to undergo any kind of evaluation or medical screening.  I explained to the mother the need for possible sedation and involuntary commitment and she is in agreement with this plan [MT]   ?1509 Manic, will be med cleared, IVC signed, pysch to see [MK]  ?1519 I reviewed the patient's labs and he is medically cleared at this time for psychiatric evaluation [MT]  ?  ?Clinical Course User Index ?[MK] Kommor, Wyn Forster, MD ?[MT] Renaye Rakers Kermit Balo, MD  ? ?                        ?Medical Decision Making ?Amount and/or Complexity of Data Reviewed ?Labs: ordered. ? ?Risk ?OTC drugs. ?Prescription drug management. ? ? ?This patient presents to the Emergency Department with complaint of psychiatric disturbance. This involves an extensive number of treatment options, and is a complaint that carries with it a high risk of complications and morbidity.  ? ?I ordered, reviewed, and interpreted labs, including BMP and CBC.  There were no immediate, life-threatening emergencies found in this labwork.  The patient was medically cleared for TTS and psychiatric evaluation. ? ?At this time, the patient is under IVC - filed by myself for SI and manic/paranoid behavior. ? ?Additional history was obtained from patient's mother at bedside ? ?Medically cleared for TTS ? ? ? ? ? ? ? ? ?Final Clinical Impression(s) / ED Diagnoses ?Final diagnoses:  ?None  ? ? ?Rx / DC Orders ?ED Discharge Orders   ? ? None  ? ?  ? ? ?  ?Terald Sleeper, MD ?11/15/21 1521 ? ?

## 2021-11-16 NOTE — BH Assessment (Signed)
[facility] Assessment Progress Note ?  ?Per [name], PA, this involuntary pt requires psychiatric hospitalization at this time.  At 12:37 French Ana calls from [facility] to report that pt has been accepted to their facility by Dr Estill Cotta to their main campus.  Fredna Dow concurs with this decision.  EDP Melene Plan, DO and pt's nurse, Dahlia Client, have been notified, and Dahlia Client agrees to call report to [phone].  Pt is to be transported via [facility]. ? ?Larry [name] ?Behavioral Health Coordinator ?[phone] ? ?  ?  ?

## 2021-11-16 NOTE — ED Notes (Signed)
Attempted report to [facility] x2 with no answer. ?

## 2021-11-16 NOTE — ED Notes (Addendum)
[facility] contacted for transport to [facility]. Reported ETA for pickup 1500-1600 today. ?

## 2021-11-16 NOTE — ED Notes (Addendum)
Patient has been resting comfortably during this shift. Patient calm and compliant as well. ?

## 2021-11-16 NOTE — ED Provider Notes (Signed)
Emergency Medicine Observation Re-evaluation Note ? ?Larry [name] is a 26 y.o. male, seen on rounds today.  Pt initially presented to the ED for complaints of Suicidal, Suicide Attempt, and Aggressive Behavior ?Currently, the patient is cooperative . ? ?Physical Exam  ?BP 124/78 (BP Location: Right Arm)   Pulse (!) 51   Temp (!) 97.5 ?F (36.4 ?C) (Oral)   Resp 18   Ht 6\' 3"  (1.905 m)   Wt 68 kg   SpO2 97%   BMI 18.75 kg/m?  ?Physical Exam ?General: No respiratory distress, cooperative ? ?ED Course / MDM  ?EKG:EKG Interpretation ? ?Date/Time:  Thursday November 15 2021 16:14:25 EDT ?Ventricular Rate:  52 ?PR Interval:  154 ?QRS Duration: 77 ?QT Interval:  384 ?QTC Calculation: 357 ?R Axis:   95 ?Text Interpretation: Sinus rhythm Borderline right axis deviation ST elevation suggests acute pericarditis Confirmed by 1979 [phone]) on 11/16/2021 1:54:54 PM ? ?I have reviewed the labs performed to date as well as medications administered while in observation.  Recent changes in the last 24 hours include : Psychiatric evaluation. ? ?Plan  ?Current plan is for for patient to be transferred to [facility]. ?[facility]. is under involuntary commitment. ?  ?Patient was excepted to [facility] paperwork completed.  Patient has remained cooperative throughout the day.   ?  ?[facility], MD ?11/16/21 1559 ? ?

## 2021-11-16 NOTE — ED Notes (Signed)
Patient's belongings (shoes, phone, and clothes) sent with patient to [facility]. ?

## 2021-11-16 NOTE — ED Notes (Signed)
Attempted report to [facility] with no answer. ?

## 2021-11-16 NOTE — ED Notes (Signed)
Report given to [place] at [facility]. ?

## 2021-11-16 NOTE — BH Assessment (Addendum)
[facility] Assessment Progress Note ?  ?Per [name], PA, this pt requires psychiatric hospitalization at this time.  Pt presents under IVC initiated by EDP Alvester Chou, MD.  Pt has been submitted to [facility] and to [facility] to be considered for admission with decisions pending as of this writig, however, due to limited bed availability Earlene Plater, MD has directed that placement is to be sought outside of the [facility] system.  The following facilities have been contacted to seek placement for this pt, with results as noted: ? ?Beds available, information sent, decision pending: ?[facility] ?[facility] ?[facility] ?[facility] ?[facility] ?[facility] ?[facility] ?[facility] ?[facility] ? ?Declined: ? (due to behavior) ? ? ?At capacity: ? ? ? ?Doylene Canning, MA ?Behavioral Health Coordinator ?[phone] ? ?

## 2022-02-18 ENCOUNTER — Ambulatory Visit (HOSPITAL_COMMUNITY)
Admission: EM | Admit: 2022-02-18 | Discharge: 2022-02-18 | Disposition: A | Discharge status: Home / Self Care | Payer: No Typology Code available for payment source

## 2022-02-18 NOTE — BH Assessment (Signed)
Larry [name] is a 26 year old male presenting voluntary to [name] due to requesting resources. Patient denied SI, HI and psychosis. When asked why are you here, patient stated several times repeatedly, "I'm good, I just needed resources to find jobs". Patient denied having a psychiatrist or therapist. Patient reported throwing psych medications in the toilet, when asked, why, patient stated "for fun". Patient gave TTS clinician consent to speak with mother, Cornelious Bryant, [phone]. Mother reported she was not aware that patient came in for help and that she just got off work. Mother reported patient is not taking psych medications due to them causing stomach pain and after his 1st psychiatrist moved he will not see anyone else. Mother reports patient trusts no one. Mother reported patient has episodes where he gets very frustrated and irritable every 3-4 months and now is about time for another episode. Mother reported patient has been diagnosed with bipolar. Mother reported that patient smokes marijuana often. Patient contracted for safety. Mother has no safety concerns regarding patient returning back home.

## 2022-02-18 NOTE — Progress Notes (Signed)
   02/18/22 2028  [facility] Triage Screening (Walk-ins at [facility] only)  How Did You Hear About [place]? Self  What Is the Reason for Your Visit/Call Today? Larry [name] is a 26 year old male presenting voluntary to [facility] due to requesting resources. Patient denied SI, HI and psychosis. When asked why are you here, patient stated several times repeatedly, "I'm good, I just needed resources to find jobs". Patient denied having a psychiatrist or therapist. Patient reported throwing psych medications in the toilet, when asked, why, patient stated "for fun". Patient gave TTS clinician consent to speak with mother, Larry [name], [phone]. Mother reported she was not aware that patient came in for help and that she just got off work. Mother reported patient is not taking psych medications due to them causing stomach pain and after his 1st psychiatrist moved he will not see anyone else. Mother reports patient trusts no one. Mother reported patient has episodes where he gets very frustrated and irritable every 3-4 months and now is about time for another episode. Mother reported patient has been diagnosed with bipolar. Mother reported that patient smokes marijuana often. Patient contracted for safety. Mother has no safety concerns regarding patient returning back home.  How Long Has This Been Causing You Problems? <Week  Have You Recently Had Any Thoughts About Hurting Yourself? No  Are You Planning to Commit Suicide/Harm Yourself At This time? No  Have you Recently Had Thoughts About Hurting Someone Larry [name]? No  Are You Planning To Harm Someone At This Time? No  Are you currently experiencing any auditory, visual or other hallucinations? No  Have You Used Any Alcohol or Drugs in the Past 24 Hours? No  Do you have any current medical co-morbidities that require immediate attention? No  Clinician description of patient physical appearance/behavior: casual / cooperative  What Do You Feel Would Help You the Most Today?  Financial Resources ("I need resources for a job")  If access to [facility] was not available, would you have sought care in the Emergency Department? No  Determination of Need Routine (7 days)  Options For Referral Medication Management;Outpatient Therapy;Other: Comment (case managment)

## 2022-02-18 NOTE — ED Notes (Signed)
Pt was d/c and his belongings was given back to patient

## 2022-06-20 ENCOUNTER — Other Ambulatory Visit: Payer: Self-pay

## 2022-06-20 ENCOUNTER — Emergency Department (HOSPITAL_COMMUNITY)
Admission: EM | Admit: 2022-06-20 | Discharge: 2022-06-21 | Disposition: A | Discharge status: Other Acute Inpatient Hospital | Payer: PRIVATE HEALTH INSURANCE | Attending: Emergency Medicine | Admitting: Emergency Medicine

## 2022-06-20 ENCOUNTER — Encounter (HOSPITAL_COMMUNITY): Payer: Self-pay

## 2022-06-20 DIAGNOSIS — R451 Restlessness and agitation: Secondary | ICD-10-CM | POA: Diagnosis not present

## 2022-06-20 DIAGNOSIS — Z046 Encounter for general psychiatric examination, requested by authority: Secondary | ICD-10-CM | POA: Insufficient documentation

## 2022-06-20 DIAGNOSIS — Z1152 Encounter for screening for COVID-19: Secondary | ICD-10-CM | POA: Insufficient documentation

## 2022-06-20 DIAGNOSIS — F1220 Cannabis dependence, uncomplicated: Secondary | ICD-10-CM | POA: Diagnosis present

## 2022-06-20 DIAGNOSIS — F29 Unspecified psychosis not due to a substance or known physiological condition: Secondary | ICD-10-CM | POA: Diagnosis not present

## 2022-06-20 DIAGNOSIS — F3111 Bipolar disorder, current episode manic without psychotic features, mild: Secondary | ICD-10-CM | POA: Insufficient documentation

## 2022-06-20 DIAGNOSIS — F600 Paranoid personality disorder: Secondary | ICD-10-CM | POA: Diagnosis not present

## 2022-06-20 DIAGNOSIS — F1210 Cannabis abuse, uncomplicated: Secondary | ICD-10-CM | POA: Insufficient documentation

## 2022-06-20 DIAGNOSIS — F3110 Bipolar disorder, current episode manic without psychotic features, unspecified: Secondary | ICD-10-CM | POA: Diagnosis present

## 2022-06-20 DIAGNOSIS — F23 Brief psychotic disorder: Secondary | ICD-10-CM

## 2022-06-20 LAB — CBC WITH DIFFERENTIAL/PLATELET
Abs Immature Granulocytes: 0.01 10*3/uL (ref 0.00–0.07)
Basophils Absolute: 0 10*3/uL (ref 0.0–0.1)
Basophils Relative: 1 %
Eosinophils Absolute: 0.1 10*3/uL (ref 0.0–0.5)
Eosinophils Relative: 1 %
HCT: 53.5 % — ABNORMAL HIGH (ref 39.0–52.0)
Hemoglobin: 17.8 g/dL — ABNORMAL HIGH (ref 13.0–17.0)
Immature Granulocytes: 0 %
Lymphocytes Relative: 31 %
Lymphs Abs: 1.9 10*3/uL (ref 0.7–4.0)
MCH: 29.6 pg (ref 26.0–34.0)
MCHC: 33.3 g/dL (ref 30.0–36.0)
MCV: 89 fL (ref 80.0–100.0)
Monocytes Absolute: 0.5 10*3/uL (ref 0.1–1.0)
Monocytes Relative: 8 %
Neutro Abs: 3.6 10*3/uL (ref 1.7–7.7)
Neutrophils Relative %: 59 %
Platelets: 167 10*3/uL (ref 150–400)
RBC: 6.01 MIL/uL — ABNORMAL HIGH (ref 4.22–5.81)
RDW: 13.5 % (ref 11.5–15.5)
WBC: 6.1 10*3/uL (ref 4.0–10.5)
nRBC: 0 % (ref 0.0–0.2)

## 2022-06-20 LAB — RESP PANEL BY RT-PCR (FLU A&B, COVID) ARPGX2
Influenza A by PCR: NEGATIVE
Influenza B by PCR: NEGATIVE
SARS Coronavirus 2 by RT PCR: NEGATIVE

## 2022-06-20 LAB — COMPREHENSIVE METABOLIC PANEL
ALT: 18 U/L (ref 0–44)
AST: 19 U/L (ref 15–41)
Albumin: 4.7 g/dL (ref 3.5–5.0)
Alkaline Phosphatase: 67 U/L (ref 38–126)
Anion gap: 9 (ref 5–15)
BUN: 15 mg/dL (ref 6–20)
CO2: 26 mmol/L (ref 22–32)
Calcium: 9.5 mg/dL (ref 8.9–10.3)
Chloride: 104 mmol/L (ref 98–111)
Creatinine, Ser: 1 mg/dL (ref 0.61–1.24)
GFR, Estimated: >60 mL/min (ref >60)
Glucose, Bld: 111 mg/dL — ABNORMAL HIGH (ref 70–99)
Potassium: 3.8 mmol/L (ref 3.5–5.1)
Sodium: 139 mmol/L (ref 135–145)
Total Bilirubin: 1.5 mg/dL — ABNORMAL HIGH (ref 0.3–1.2)
Total Protein: 8.2 g/dL — ABNORMAL HIGH (ref 6.5–8.1)

## 2022-06-20 LAB — ETHANOL: Alcohol, Ethyl (B): <10 mg/dL (ref <10)

## 2022-06-20 MED ORDER — ZIPRASIDONE MESYLATE 20 MG IM SOLR
20.0000 mg | Freq: Once | INTRAMUSCULAR | Status: DC
Start: 2022-06-20 — End: 2022-06-20

## 2022-06-20 MED ORDER — OLANZAPINE 10 MG PO TBDP: 10.0000 mg | ORAL_TABLET | Freq: Three times a day (TID) | ORAL | Status: DC | PRN

## 2022-06-20 MED ORDER — LORAZEPAM 1 MG PO TABS
1.0000 mg | ORAL_TABLET | ORAL | Status: AC | PRN
  Administered 2022-06-20: 1 mg via ORAL
  Filled 2022-06-20: qty 1

## 2022-06-20 MED ORDER — OLANZAPINE 5 MG PO TBDP
5.0000 mg | ORAL_TABLET | Freq: Every day | ORAL | Status: DC
  Administered 2022-06-20: 5 mg via ORAL
  Filled 2022-06-20: qty 1

## 2022-06-20 MED ORDER — ZIPRASIDONE MESYLATE 20 MG IM SOLR: 20.0000 mg | Freq: Two times a day (BID) | INTRAMUSCULAR | Status: DC | PRN
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
## 2022-06-21 NOTE — Progress Notes (Signed)
Inpatient Behavioral Health Placement  Pt meets inpatient criteria per Lindon Romp, NP.  Referral was sent to the following facilities;   Destination Service Provider Address Phone Fax  [facility]  [address], [place] [address] [phone] [place], [place] [address] [phone]  [facility]  [address] [place] [address] [place]  [facility]  [address], [place] [address] [phone]  [place] [address], [place] [address] [phone]  [facility]  [place] [address] [phone]  [facility]  [address], [place] [address] [phone] [place]  [address] [place] [address] [phone]  [facility]  [address] [place] [address] [phone]    Situation ongoing,  CSW will follow up.   Benjaman Kindler, MSW, LCSWA 06/21/2022  @ 12:20 AM

## 2022-06-21 NOTE — ED Notes (Signed)
Would not get up to take vitals. Would not respond to request.

## 2022-06-21 NOTE — Discharge Instructions (Addendum)
Transfer to [facility] 

## 2022-06-21 NOTE — ED Provider Notes (Signed)
Emergency Medicine Observation Re-evaluation Note  Carney Saxton. is a 26 y.o. male, seen on rounds today.  Pt initially presented to the ED for complaints of Psychiatric Evaluation Currently, the patient is resting, calm, no distress.   Physical Exam  BP 124/85 (BP Location: Right Arm)   Pulse 60   Temp 98.1 F (36.7 C) (Oral)   Resp 18   Ht 1.905 m (6\' 3" )   Wt 68 kg   SpO2 100%   BMI 18.74 kg/m  Physical Exam General: calm, resting.  Cardiac: regular rate.  Lungs: breathing comfortably. Psych: calm, does not currently appear to be responding to internal stimuli.   ED Course / MDM    I have reviewed the labs performed to date as well as medications administered while in observation.  Recent changes in the last 24 hours include ED obs, reassessment.   Plan  Pt accepted to [facility], Dr Selinda Flavin.  Pt currently appears stable for transfer/transport.     Lajean Saver, MD 06/21/22 [phone]

## 2022-06-21 NOTE — ED Notes (Signed)
This RN called [facility] facility again, with no answer. This RN trying to give report to accepting facility. Call back number left and voicemail for staff to return the call

## 2022-06-21 NOTE — ED Notes (Signed)
This RN called x2 to give report. Left a callback number. Will try again before 0700

## 2022-06-21 NOTE — Progress Notes (Signed)
Pt was accepted to [facility] 06/21/22; Main [facility]  Pt meets inpatient criteria per Lindon Romp, NP  Attending Physician will be Dr. Jonelle Sports  Report can be called to: [phone]   Pt can arrive after 9:00am  Nursing Notified: Towanda Malkin, RN  Nadara Mode, LCSWA 06/21/2022 @ 1:14 AM

## 2022-09-23 ENCOUNTER — Other Ambulatory Visit: Payer: Self-pay

## 2022-09-23 ENCOUNTER — Emergency Department (HOSPITAL_COMMUNITY)
Admission: EM | Admit: 2022-09-23 | Discharge: 2022-09-24 | Disposition: A | Discharge status: Home / Self Care | Payer: Self-pay | Attending: Emergency Medicine | Admitting: Emergency Medicine

## 2022-09-23 DIAGNOSIS — F12221 Cannabis dependence with intoxication delirium: Secondary | ICD-10-CM | POA: Insufficient documentation

## 2022-09-23 DIAGNOSIS — D696 Thrombocytopenia, unspecified: Secondary | ICD-10-CM | POA: Insufficient documentation

## 2022-09-23 DIAGNOSIS — W272XXA Contact with scissors, initial encounter: Secondary | ICD-10-CM | POA: Insufficient documentation

## 2022-09-23 DIAGNOSIS — R45851 Suicidal ideations: Secondary | ICD-10-CM | POA: Insufficient documentation

## 2022-09-23 DIAGNOSIS — F332 Major depressive disorder, recurrent severe without psychotic features: Secondary | ICD-10-CM | POA: Insufficient documentation

## 2022-09-23 DIAGNOSIS — S81811A Laceration without foreign body, right lower leg, initial encounter: Secondary | ICD-10-CM | POA: Insufficient documentation
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
## 2022-09-23 NOTE — ED Notes (Signed)
Pt refused to dress out in purple scrubs.

## 2022-09-24 LAB — COMPREHENSIVE METABOLIC PANEL
ALT: 14 U/L (ref 0–44)
AST: 20 U/L (ref 15–41)
Albumin: 4 g/dL (ref 3.5–5.0)
Alkaline Phosphatase: 55 U/L (ref 38–126)
Anion gap: 7 (ref 5–15)
BUN: 12 mg/dL (ref 6–20)
CO2: 29 mmol/L (ref 22–32)
Calcium: 9.3 mg/dL (ref 8.9–10.3)
Chloride: 104 mmol/L (ref 98–111)
Creatinine, Ser: 1.25 mg/dL — ABNORMAL HIGH (ref 0.61–1.24)
GFR, Estimated: >60 mL/min (ref >60)
Glucose, Bld: 95 mg/dL (ref 70–99)
Potassium: 4.1 mmol/L (ref 3.5–5.1)
Sodium: 140 mmol/L (ref 135–145)
Total Bilirubin: 0.6 mg/dL (ref 0.3–1.2)
Total Protein: 6.8 g/dL (ref 6.5–8.1)

## 2022-09-24 LAB — CBC
HCT: 50.2 % (ref 39.0–52.0)
Hemoglobin: 17.1 g/dL — ABNORMAL HIGH (ref 13.0–17.0)
MCH: 30.3 pg (ref 26.0–34.0)
MCHC: 34.1 g/dL (ref 30.0–36.0)
MCV: 89 fL (ref 80.0–100.0)
Platelets: 144 10*3/uL — ABNORMAL LOW (ref 150–400)
RBC: 5.64 MIL/uL (ref 4.22–5.81)
RDW: 13.3 % (ref 11.5–15.5)
WBC: 7.4 10*3/uL (ref 4.0–10.5)
nRBC: 0 % (ref 0.0–0.2)

## 2022-09-24 LAB — SALICYLATE LEVEL: Salicylate Lvl: <7 mg/dL — ABNORMAL LOW (ref 7.0–30.0)

## 2022-09-24 LAB — ACETAMINOPHEN LEVEL: Acetaminophen (Tylenol), Serum: <10 ug/mL — ABNORMAL LOW (ref 10–30)

## 2022-09-24 LAB — RAPID URINE DRUG SCREEN, HOSP PERFORMED
Amphetamines: NOT DETECTED
Barbiturates: NOT DETECTED
Benzodiazepines: NOT DETECTED
Cocaine: NOT DETECTED
Opiates: NOT DETECTED
Tetrahydrocannabinol: POSITIVE — AB

## 2022-09-24 LAB — ETHANOL: Alcohol, Ethyl (B): <10 mg/dL (ref <10)

## 2022-09-24 MED ORDER — ALUM & MAG HYDROXIDE-SIMETH 200-200-20 MG/5ML PO SUSP: 30.0000 mL | Freq: Four times a day (QID) | ORAL | Status: DC | PRN

## 2022-09-24 MED ORDER — ONDANSETRON HCL 4 MG PO TABS: 4.0000 mg | ORAL_TABLET | Freq: Three times a day (TID) | ORAL | Status: DC | PRN

## 2022-09-24 MED ORDER — NICOTINE 21 MG/24HR TD PT24: 21.0000 mg | MEDICATED_PATCH | Freq: Every day | TRANSDERMAL | Status: DC

## 2022-09-24 MED ORDER — ACETAMINOPHEN 325 MG PO TABS: 650.0000 mg | ORAL_TABLET | ORAL | Status: DC | PRN

## 2022-09-24 NOTE — Discharge Instructions (Signed)
Please follow-up with one of the outpatient resources noted here to get treatment for your bipolar disorder.  Return to the emergency department if you are having any problems.

## 2022-09-24 NOTE — ED Provider Notes (Signed)
[facility] Provider Note   CSN: [id number] Arrival date & time: 09/23/22  2335     History  Chief Complaint  Patient presents with   Suicidal    Larry [name] is a 27 y.o. male.  The history is provided by the patient.  He has history of bipolar disorder and states that he has not been taking his medication for at least 3-4 years.  He is chronically depressed and states that he likes to break things.  He frequently will punch holes in the wall at home.  Today, he tried to cut himself with a scissors.  He cut himself on his right leg.  He does not state that this was actually suicidal intent.  He does admit to smoking marijuana but denies other drug use and denies ethanol use.  He denies any hallucinations.  He wants to get back in the mental health system.   Home Medications Prior to Admission medications   Not on File      Allergies    Zithromax [azithromycin dihydrate]    Review of Systems   Review of Systems  All other systems reviewed and are negative.   Physical Exam Updated Vital Signs BP (!) 139/90 (BP Location: Right Arm)   Pulse 70   Temp 99 F (37.2 C) (Oral)   Resp 18   Ht 6' (1.829 m)   Wt 90.7 kg   SpO2 100%   BMI 27.12 kg/m  Physical Exam Vitals and nursing note reviewed.   27 year old male, resting comfortably and in no acute distress. Vital signs are normal. Oxygen saturation is 100%, which is normal. Head is normocephalic and atraumatic. PERRLA, EOMI. Oropharynx is clear. Neck is nontender and supple without adenopathy. Lungs are clear without rales, wheezes, or rhonchi. Chest is nontender. Heart has regular rate and rhythm without murmur. Abdomen is soft, flat, nontender. Extremities have no cyanosis or edema, full range of motion is present.  Superficial laceration is present on the lateral aspect of the right calf, not requiring closure. Skin is warm and dry without rash. Neurologic: Awake  and alert but with depressed affect, speech is normal, cranial nerves are intact, moves all extremities equally.  ED Results / Procedures / Treatments   Labs (all labs ordered are listed, but only abnormal results are displayed) Labs Reviewed  CBC - Abnormal; Notable for the following components:      Result Value   Hemoglobin 17.1 (*)    Platelets 144 (*)    All other components within normal limits  COMPREHENSIVE METABOLIC PANEL  ETHANOL  SALICYLATE LEVEL  ACETAMINOPHEN LEVEL  RAPID URINE DRUG SCREEN, HOSP PERFORMED   Procedures Procedures    Medications Ordered in ED Medications - No data to display  ED Course/ Medical Decision Making/ A&P                             Medical Decision Making Amount and/or Complexity of Data Reviewed Labs: ordered.  Risk OTC drugs. Prescription drug management.   Bipolar disorder with medication noncompliance.  Currently having major depression with self-harm behavior although unclear if he is truly suicidal.  I have reviewed reviewed his past records, he had Tdap booster 05/24/2018.  He was seen in the emergency department on 06/20/2022 and was transferred to [facility] for psychiatric care.  I have requested TTS evaluation.  I have reviewed and interpreted his  laboratory tests, and my interpretation is mild elevation of serum creatinine of uncertain clinical significance and otherwise normal comprehensive metabolic panel, borderline polycythemia which is actually improved compared with prior and is not felt to be clinically significant, but borderline thrombocytopenia which is also not felt to be clinically significant.  Ethanol, salicylate, acetaminophen are undetectable.  TTS consultation is appreciated.  Patient does not need inpatient care.  He is being given outpatient resources for mental health services.  Return precautions discussed.  Final Clinical Impression(s) / ED Diagnoses Final diagnoses:  Severe episode of recurrent major  depressive disorder, without psychotic features ([place])  Laceration of right lower leg, initial encounter  Thrombocytopenia [facility])    Rx / DC Orders ED Discharge Orders     None         Delora Fuel, MD 14/97/02 [phone]

## 2022-09-24 NOTE — BH Assessment (Signed)
Comprehensive Clinical Assessment (CCA) Note  09/24/2022 Larry [name] [id number]  Disposition: Clinical report given to Larry Salinas, NP. Patient psychiatrically cleared with recommendation for outpatient follow-up.  The patient demonstrates the following risk factors for suicide: Chronic risk factors for suicide include: psychiatric disorder of bipolar disorder . Acute risk factors for suicide include: unemployment. Protective factors for this patient include: positive social support. Considering these factors, the overall suicide risk at this point appears to be none. Patient is appropriate for outpatient follow up.  Patient is a 27 y.o. single male who presents voluntarily to [facility] ED, via Event organiser. When asked what happened tonight, Patient stated "I flashed out, it happens every other month. I get on my fuck shit." Patient reports he called police because he knew he wanted to destroy the home. Patient has a history of bipolar disorder. Patient denies anything triggering him tonight and reiterates that he "just get this way." When asked if he has been experiencing symptoms of depression or anxiety, Pt says "no just everybody is moving fake." Patient denies HI, auditory or visual hallucinations. Patient has cutting on his leg, which he states he did for relief in order to fill better. Patient states he has been smoking marijuana today. Patient shares if he does not smoke he has a bad day and he will make sure no one will have a good day. Patient denies additional substance use.  Patient identifies finances as being a trigger at this time. Patient reports he can not work because his mother will not let him. Patient also states he is not going to work for someone who will not let him do what he wants to do. Patient lives with his mother. He states he does not drive, dropped out of school in the 8th grade and does not have a diploma. Patient expresses he should be in a better position by  now. Patient identifies his mother as his only support. Patient states "you people don't care about me." Patient states the only people who ever cared about his was his therapist from child hood and his mother. Patient states he has a court date on Wednesday for charges related to assault on a male. Patient denies any current access to guns.  Patient states he was prescribed medication when discharged from [facility] in October. Patient reports he flushed all the medications down the toilet because he does not need it. When asked how he could best be helped, Patient states "I don't know. Y'all should have been put the puzzle together." Clinician asked if his medications could have been helpful and patient reiterates they are not helpful. He admits he has never consistently taken prescribed medication.  Patient is dressed casually, oriented x4 with slightly pressured speech. Patient has good eye contact and is cooperative throughout the assessment, although there are moments that he appears irritated. Patient is lacking insight to his mental health. Patient denies there are firearms in the home and reports he feels safe going home. Patient states "every one has their moments."  Collateral provided by patient's mother Larry [name] [phone], with patient's permission. Patient's mother reports she does not know what triggered patient. She says patient gets like this every few months. Patient's mother did not have additional information, however she expressed she believes patient will be safe turning.  Chief Complaint:  Chief Complaint  Patient presents with   Suicidal   Visit Diagnosis:  Bipolar I disorder Severe cannabis use disorder ([place])    CCA  Screening, Triage and Referral (STR)  Patient Reported Information How did you hear about [place]? Legal System ([employer])  What Is the Reason for Your Visit/Call Today? Patient arrived to [facility] ED voluntarily, accompanied by [employer]. When asked what  brought him to the ED, patient states "I flashed out, it happens every other month. I get on some fuck shit and I knew I was going to damage the crib." Patient denies current SI, HI, AH or VH.  How Long Has This Been Causing You Problems? 1-6 months  What Do You Feel Would Help You the Most Today? Treatment for Depression or other mood problem   Have You Recently Had Any Thoughts About Hurting Yourself? No  Are You Planning to Commit Suicide/Harm Yourself At This time? No   Flowsheet Row ED from 09/23/2022 in [facility] Emergency Department at [facility] ED from 06/20/2022 in [facility] Emergency Department at [facility] ED from 11/15/2021 in [facility] Emergency Department at [facility]  C-SSRS RISK CATEGORY High Risk High Risk High Risk       Have you Recently Had Thoughts About Hurting Someone [name]? No  Are You Planning to Harm Someone at This Time? No  Explanation: N/A   Have You Used Any Alcohol or Drugs in the Past 24 Hours? Yes  What Did You Use and How Much? Patient reports he smoked 4 "blunts" today. Patient last smoked at 4p.   Do You Currently Have a Therapist/Psychiatrist? No  Name of Therapist/Psychiatrist: Name of Therapist/Psychiatrist: N/A   Have You Been Recently Discharged From Any Office Practice or Programs? Yes  Explanation of Discharge From Practice/Program: Patient was discharged from [facility] in October 2023.     CCA Screening Triage Referral Assessment Type of Contact: Tele-Assessment  Telemedicine Service Delivery: Telemedicine service delivery: This service was provided via telemedicine using a 2-way, interactive audio and video technology  Is this Initial or Reassessment? Is this Initial or Reassessment?: Initial Assessment  Date Telepsych consult ordered in [facility]:  Date Telepsych consult ordered in [facility]: 09/24/22  Time Telepsych consult ordered in [facility]:  Time Telepsych consult ordered in [facility]:  0113  Location of Assessment: [facility] ED  Provider Location: [facility] Assessment Services   Collateral Involvement: Imagene Gurney (mother) [phone]   Does Patient Have a Court Appointed Legal Guardian? No  Legal Guardian Contact Information: N/A  Copy of Legal Guardianship Form: -- (N/A)  Legal Guardian Notified of Arrival: -- (N/A)  Legal Guardian Notified of Pending Discharge: -- (N/A)  If Minor and Not Living with Parent(s), Who has Custody? N/A  Is CPS involved or ever been involved? Never  Is APS involved or ever been involved? Never   Patient Determined To Be At Risk for Harm To Self or Others Based on Review of Patient Reported Information or Presenting Complaint? No  Method: No Plan (No SI/HI)  Availability of Means: No access or NA (No SI/HI)  Intent: Vague intent or NA (No SI/HI)  Notification Required: No need or identified person (No SI/HI)  Additional Information for Danger to Others Potential: -- (N/A)  Additional Comments for Danger to Others Potential: N/A  Are There Guns or Other Weapons in Your Home? No  Types of Guns/Weapons: N/A  Are These Weapons Safely Secured?                            -- (N/A)  Who Could Verify You  Are Able To Have These Secured: N/A  Do You Have any Outstanding Charges, Pending Court Dates, Parole/Probation? Patient reports having a court date on Wednesday due to assualt on a male, trespassing and communicating threats.  Contacted To Inform of Risk of Harm To Self or Others: -- (N/A)    Does Patient Present under Involuntary Commitment? No    [place] of Residence: [place]   Patient Currently Receiving the Following Services: Not Receiving Services   Determination of Need: Routine (7 days)   Options For Referral: Outpatient Therapy; Medication Management     CCA Biopsychosocial Patient Reported Schizophrenia/Schizoaffective Diagnosis in Past: No   Strengths: Patient recognizes when he is in need  of mental health assistance   Mental Health Symptoms Depression:   None   Duration of Depressive symptoms:    Mania:   None   Anxiety:    None   Psychosis:   None   Duration of Psychotic symptoms:    Trauma:   None   Obsessions:   None   Compulsions:   None   Inattention:   None   Hyperactivity/Impulsivity:   None   Oppositional/Defiant Behaviors:   None   Emotional Irregularity:   None   Other Mood/Personality Symptoms:  N/A    Mental Status Exam Appearance and self-care  Stature:   Tall   Weight:   Average weight   Clothing:  No data recorded  Grooming:   Normal   Cosmetic use:   None   Posture/gait:   Normal   Motor activity:   Not Remarkable   Sensorium  Attention:   Normal   Concentration:   Normal   Orientation:   Object; Person; Place; Situation   Recall/memory:   Normal   Affect and Mood  Affect:   Negative   Mood:   Irritable; Negative   Relating  Eye contact:   Normal   Facial expression:  Responsive   Attitude toward examiner:   Cooperative; Defensive   Thought and Language  Speech flow:  Normal   Thought content:   Appropriate to Mood and Circumstances   Preoccupation:   None   Hallucinations:  None   Organization:   Linear   Transport planner of Knowledge:   Average   Intelligence:   Average   Abstraction:   Normal   Judgement:   Impaired   Reality Testing:   Realistic   Insight:   Lacking   Decision Making:   Impulsive   Social Functioning  Social Maturity:   Self-centered   Social Judgement:   Normal   Stress  Stressors:   Teacher, music Ability:   Overwhelmed; Deficient supports   Skill Deficits:   None   Supports:   Family     Religion: Religion/Spirituality Are You A Religious Person?: Yes What is Your Religious Affiliation?:  (Ifa) How Might This Affect Treatment?: N/A  Leisure/Recreation:    Exercise/Diet:     CCA  Employment/Education Employment/Work Situation:    Education:     CCA Family/Childhood History Family and Relationship History:    Childhood History:          CCA Substance Use Alcohol/Drug Use:                           ASAM's:  Six Dimensions of Multidimensional Assessment  Dimension 1:  Acute Intoxication and/or Withdrawal Potential:      Dimension 2:  Biomedical Conditions  and Complications:      Dimension 3:  Emotional, Behavioral, or Cognitive Conditions and Complications:     Dimension 4:  Readiness to Change:     Dimension 5:  Relapse, Continued use, or Continued Problem Potential:     Dimension 6:  Recovery/Living Environment:     ASAM Severity Score:    ASAM Recommended Level of Treatment:     Substance use Disorder (SUD)    Recommendations for Services/Supports/Treatments:    Discharge Disposition:    DSM5 Diagnoses: Patient Active Problem List   Diagnosis Date Noted   Bipolar I disorder, most recent episode depressed (HCC)    Antisocial personality disorder (HCC) 05/26/2018   Severe cannabis use disorder (HCC) 05/26/2018   Bipolar I disorder, most recent episode (or current) manic (HCC) 05/25/2018   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

## 2022-09-24 NOTE — ED Provider Triage Note (Signed)
Emergency Medicine Provider Triage Evaluation Note  Larry [name] , a 27 y.o. male  was evaluated in triage.  Pt complains of suicidal attempt, states that he feels as if no one is listening, states that he told his mother that he could kill himself and she said go ahead, he states that he tried to kill himself by cutting himself with scissors.  States he cut his right lower leg.  He states that he has not been taking his bipolar medications, states that he needs to go back to the mental hospital to get his head right..  Review of Systems  Positive: Suicide attempt, superficial ideation Negative: Chest pain, shortness of breath  Physical Exam  BP (!) 139/90 (BP Location: Right Arm)   Pulse 70   Temp 99 F (37.2 C) (Oral)   Resp 18   Ht 6' (1.829 m)   Wt 90.7 kg   SpO2 100%   BMI 27.12 kg/m  Gen:   Awake, no distress   Resp:  Normal effort  MSK:   Moves extremities without difficulty  Other:    Medical Decision Making  Medically screening exam initiated at 12:19 AM.  Appropriate orders placed.  Larry [name] was informed that the remainder of the evaluation will be completed by another provider, this initial triage assessment does not replace that evaluation, and the importance of remaining in the ED until their evaluation is complete.  Lab work have been ordered patient was assigned a room and we brought back momentarily.   Marcello Fennel, PA-C 09/24/22 0020

## 2023-09-30 ENCOUNTER — Telehealth (HOSPITAL_COMMUNITY): Payer: Self-pay | Admitting: Licensed Clinical Social Worker

## 2023-09-30 NOTE — Telephone Encounter (Signed)
The therapist sends the following, secure email to [name], B.S., CADC-I, [facility] Care Manager:  "Good morning,  If Mr. Wanat has any questions, he can call and speak with Ms. Remigio Eisenmenger, LMFT, LCAS directly at [phone]. You can inform him that he can walk-in Monday through Friday at [facility] on the 2nd floor to initiate services; however, he would need to arrive around 7:30 a.m. to assure he would be seen.  Sincerely,"  Myrna Blazer, MA, LCSW, [facility], LCAS 09/30/2023

## 2023-09-30 NOTE — Telephone Encounter (Addendum)
The therapist receives a ROI from [facility] for this patient and sends the following, secure email:  "Mr. Galindo received a Comprehensive Clinical Assessment on 09/24/22 by a LCSW after the presented voluntarily to [facility] ED via law enforcement. He was cleared with a recommendation for outpatient follow-up. He has had no subsequent contact with [place] since 09/24/22 and has no outpatient, behavioral health appointments scheduled in our system at this time."  [facility] responds with the following:  "Mr. Goswick was instructed to report for mental health with [facility] and to follow through with any treatment recommendations. If outpatient treatment was recommended as follow-up, this will be addressed next [facility] 10/06/23 at 12pm. Who is the best contact person for him to follow up with this recommendation?"   Myrna Blazer, MA, LCSW, [facility], LCAS 09/30/2023

## 2023-10-01 ENCOUNTER — Ambulatory Visit (INDEPENDENT_AMBULATORY_CARE_PROVIDER_SITE_OTHER): Payer: Medicaid Other | Admitting: Physician Assistant

## 2023-10-01 VITALS — BP 123/85 | HR 54 | Temp 97.7°F | Ht 72.0 in | Wt 175.4 lb

## 2023-10-01 DIAGNOSIS — F411 Generalized anxiety disorder: Secondary | ICD-10-CM | POA: Insufficient documentation

## 2023-10-01 DIAGNOSIS — F3130 Bipolar disorder, current episode depressed, mild or moderate severity, unspecified: Secondary | ICD-10-CM | POA: Diagnosis not present

## 2023-10-01 DIAGNOSIS — F1220 Cannabis dependence, uncomplicated: Secondary | ICD-10-CM

## 2023-10-01 MED ORDER — QUETIAPINE FUMARATE 50 MG PO TABS: ORAL_TABLET | ORAL | 0 refills | Status: AC

## 2023-10-01 MED ORDER — QUETIAPINE FUMARATE 150 MG PO TABS: 100.0000 mg | ORAL_TABLET | Freq: Every day | ORAL | 1 refills | Status: DC

## 2023-10-01 MED ORDER — HYDROXYZINE HCL 10 MG PO TABS: 10.0000 mg | ORAL_TABLET | Freq: Three times a day (TID) | ORAL | 1 refills | Status: DC | PRN

## 2023-10-01 NOTE — Progress Notes (Signed)
Psychiatric Initial Adult Assessment   Patient Identification: Larry [name] MRN:  [id number] Date of Evaluation:  10/01/2023 Referral Source: Reestablish psychiatric care Chief Complaint:   Chief Complaint  Patient presents with   Establish Care   Medication Management   Visit Diagnosis:    ICD-10-CM   1. Bipolar I disorder, most recent episode depressed (HCC)  F31.30 QUEtiapine (SEROQUEL) 50 MG tablet    QUEtiapine Fumarate 150 MG TABS    DISCONTINUED: QUEtiapine 150 MG TABS    2. Severe cannabis use disorder (HCC)  F12.20     3. Anxiety state  F41.1 hydrOXYzine (ATARAX) 10 MG tablet      History of Present Illness:    Larry [name] is a 28 year old male with a past psychiatric history significant for schizophrenia and bipolar disorder who presents to [facility] to establish psychiatric care and for medication management.  Patient presents to the encounter stating that he was instructed to attend this appointment by his probation officer.  Patient reports that he has a history of schizophrenia and bipolar disorder.  He reports that he was diagnosed with schizophrenia 2 years ago.  He reports that he received this diagnosis 2 years ago when hospitalized at [facility].  Patient reports that he has a history of taking medication for bipolar disorder when he was young.  He has a history of slashing his medications, appointments.  Patient does not remember taking the medication but does state remembers being happy shortly after taking it.  Patient endorses smoking weed.  He denies having any major problems and states that he just does not like listening to people and does what he wants to at the end of the day.  Patient endorses depression and rates his depression at 10 out of 10 with 10 being most severe.  Due to his depression, patient states that he has a tendency to smoke.  He reports that he regularly smokes roughly 15 blunts per  day.  Patient endorses depressive episodes every day.  Patient endorses the following depressive symptoms: feelings of sadness, lack of motivation, decreased concentration, irritability, and feelings of guilt/worthlessness.  Patient denies decreased energy or hopelessness.  Patient also endorses elevated anxiety stating that his anxiety prevents him from being able to obtain a job.  In regards to manic episodes, patient reports that he has experienced the following symptoms: going multiple days without sleep, extravagant spending, racing thoughts.  Patient denies increased energy or engaging in increased sexual activity.  Patient also reports that he experiences auditory hallucinations.  He last experienced auditory hallucinations week ago characterized by whispers.  In addition to auditory hallucinations, patient states that he experienced something going past his face.  Patient also endorses paranoia stating that he feels that people are out to kill him.  Patient endorses a past history of hospitalization due to mental health.  He reports that when he was last hospitalized at [facility], he was diagnosed with schizophrenia (2 weeks ago).  Patient endorses a past history of suicide attempt stating that he once jumped out of a moving car.  A PHQ-9 screen was performed with the patient scoring a 23.  A GAD-7 screen was also performed with the patient scoring an 18.  Patient is alert and oriented x 4, calm, for no, and fully engaged in conversation during the encounter.  Patient endorses neutral mood.  Patient exhibits depressed mood with appropriate affect.  Patient denies suicidal or homicidal ideation.  He further denies auditory or visual hallucinations at this time and does not appear to be responding to internal/external stimuli.  Patient denies paranoia or delusional thoughts.  Patient endorses poor sleep and receives on average 3 hours of sleep per night.  Patient endorses decreased appetite.  Patient  denies alcohol consumption.  Patient endorses tobacco use.  Patient endorses illicit drug use in the form of marijuana.  Associated Signs/Symptoms: Depression Symptoms:  depressed mood, anhedonia, insomnia, psychomotor agitation, fatigue, feelings of worthlessness/guilt, difficulty concentrating, hopelessness, impaired memory, recurrent thoughts of death, anxiety, loss of energy/fatigue, disturbed sleep, weight loss, decreased appetite, (Hypo) Manic Symptoms:  Delusions, Distractibility, Elevated Mood, Flight of Ideas, Licensed conveyancer, Grandiosity, Hallucinations, Impulsivity, Irritable Mood, Labiality of Mood, Anxiety Symptoms:  Agoraphobia, Excessive Worry, Obsessive Compulsive Symptoms:   Patient reports that everything needs to be clean, Social Anxiety, Specific Phobias, Psychotic Symptoms:  Delusions, Hallucinations: Auditory Visual Paranoia, PTSD Symptoms: Had a traumatic exposure:  Patient reports that he has been deeply impacted by things but did not want to go into detail  Had a traumatic exposure in the last month:  N/A Re-experiencing:  Flashbacks Intrusive Thoughts Nightmares Hypervigilance:  Yes Hyperarousal:  Difficulty Concentrating Irritability/Anger Sleep Avoidance:  Decreased Interest/Participation Foreshortened Future  Past Psychiatric History:  Patient endorses a past psychiatric history significant for schizophrenia and bipolar disorder  Patient endorses a past history of hospitalization due to mental health.  He reports that he was last hospitalized at [facility] when diagnosed with schizophrenia.  Patient endorses a past history of suicide attempt stating that he once tried to jump out of a moving car  Patient denies a past history of homicide attempt  Previous Psychotropic Medications: Yes   Substance Abuse History in the last 12 months:  Yes.    Consequences of Substance Abuse: Patient endorses a past history of  heavy marijuana use  Medical Consequences:  Patient reports that he was hospitalized due to marijuana use stating that he was heart thumping after smoking weed Legal Consequences:  Patient reports that he has been charged with possession Family Consequences:  Patient denies Blackouts:  Patient denies DT's: Patient denies Withdrawal Symptoms:   Diaphoresis  Past Medical History:  Past Medical History:  Diagnosis Date   Bipolar 1 disorder (HCC)    History reviewed. No pertinent surgical history.  Family Psychiatric History:  Patient is unsure of family history of psychiatric illness  Family history of suicide attempt: Patient denies Family history of homicide attempt: Patient denies Family history of substance abuse: Patient reports some of his family members smoked marijuana  Family History: History reviewed. No pertinent family history.  Social History:   Social History   Socioeconomic History   Marital status: Single    Spouse name: Not on file   Number of children: Not on file   Years of education: Not on file   Highest education level: Not on file  Occupational History   Not on file  Tobacco Use   Smoking status: Every Day    Types: Cigarettes   Smokeless tobacco: Never  Substance and Sexual Activity   Alcohol use: No   Drug use: Yes    Types: Marijuana   Sexual activity: Yes  Other Topics Concern   Not on file  Social History Narrative   Not on file   Social Drivers of Health   Financial Resource Strain: Not on file  Food Insecurity: Not on file  Transportation Needs: Not on file  Physical Activity: Not  on file  Stress: Not on file  Social Connections: Not on file    Additional Social History:  Patient endorses social support.  Patient denies having children of his own.  Patient denies housing.  Patient is currently unemployed.  Patient denies a past history of military experience.  Patient states that he has been to jail since he was a juvenile.   Highest education and by the patient is eighth grade.  Patient endorses access to weapons in the form of a pocket knife.  Allergies:   Allergies  Allergen Reactions   Zithromax [Azithromycin Dihydrate] Rash    Metabolic Disorder Labs: Lab Results  Component Value Date   HGBA1C 5.4 11/23/2020   MPG 108.28 11/23/2020   No results found for: "PROLACTIN" Lab Results  Component Value Date   CHOL 131 11/23/2020   TRIG 58 11/23/2020   HDL 34 (L) 11/23/2020   CHOLHDL 3.9 11/23/2020   VLDL 12 11/23/2020   LDLCALC 85 11/23/2020   Lab Results  Component Value Date   TSH 2.097 11/23/2020    Therapeutic Level Labs: No results found for: "LITHIUM" No results found for: "CBMZ" Lab Results  Component Value Date   VALPROATE 42 (L) 05/29/2018    Current Medications: Current Outpatient Medications  Medication Sig Dispense Refill   hydrOXYzine (ATARAX) 10 MG tablet Take 1 tablet (10 mg total) by mouth 3 (three) times daily as needed. 75 tablet 1   QUEtiapine (SEROQUEL) 50 MG tablet Take 1 tablet (50 mg total) by mouth at bedtime for 6 days, THEN 2 tablets (100 mg total) at bedtime for 6 days, THEN 3 tablets (150 mg total) at bedtime for 18 days. 72 tablet 0   [START ON 10/30/2023] QUEtiapine Fumarate 150 MG TABS Take 150 mg by mouth at bedtime. 30 tablet 1   No current facility-administered medications for this visit.    Musculoskeletal: Strength & Muscle Tone: within normal limits Gait & Station: normal Patient leans: N/A  Psychiatric Specialty Exam: Review of Systems  Psychiatric/Behavioral:  Positive for dysphoric mood, hallucinations and sleep disturbance. Negative for decreased concentration, self-injury and suicidal ideas. The patient is nervous/anxious. The patient is not hyperactive.     Blood pressure 123/85, pulse (!) 54, temperature 97.7 F (36.5 C), temperature source Oral, height 6' (1.829 m), weight 175 lb 6.4 oz (79.6 kg), SpO2 100%.Body mass index is 23.79 kg/m.   General Appearance: Casual  Eye Contact:  Good  Speech:  Clear and Coherent and Normal Rate  Volume:  Normal  Mood:  Anxious and Depressed  Affect:  Congruent  Thought Process:  Coherent, Goal Directed, and Descriptions of Associations: Intact  Orientation:  Full (Time, Place, and Person)  Thought Content:  Hallucinations: Auditory  Suicidal Thoughts:  No  Homicidal Thoughts:  No  Memory:  Immediate;   Good Recent;   Good Remote;   Fair  Judgement:  Fair  Insight:  Present  Psychomotor Activity:  Normal  Concentration:  Concentration: Good and Attention Span: Good  Recall:  Good  Fund of Knowledge:Good  Language: Good  Akathisia:  No  Handed:  Right  AIMS (if indicated):  not done  Assets:  Communication Skills Desire for Improvement Physical Health Transportation  ADL's:  Intact  Cognition: WNL  Sleep:  Poor   Screenings: AIMS    Flowsheet Row [facility]  AIMS Total Score 0      AUDIT    Flowsheet Row Ad[facility] (  [facility]  Alcohol Use Disorder Identification Test Final Score (AUDIT) 0      GAD-7    Flowsheet Row Office Visit from 10/01/2023 in [facility]  Total GAD-7 Score 18      PHQ2-9    Flowsheet Row Office Visit from 10/01/2023 in [facility] ED from 08/27/2020 in [facility]  PHQ-2 Total Score 6 6  PHQ-9 Total Score 23 27      Flowsheet Row Office Visit from 10/01/2023 in [facility] ED from 09/23/2022 in [facility] Emergency Department at [facility] ED from 06/20/2022 in [facility] Emergency Department at [facility]  C-SSRS RISK CATEGORY Moderate Risk No Risk High Risk       Assessment and Plan:   Larry Fail Talal Fritchman. is a 28 year old male with a past psychiatric  history significant for schizophrenia and bipolar disorder who presents to [facility] to establish psychiatric care and for medication management.  Patient presents to the encounter stating that this encounter mandatory per parole officer.  Patient states that he has a past history of schizophrenia and bipolar disorder.  He states that he was diagnosed with schizophrenia while hospitalized at [facility] roughly 2 years ago.  Patient has a past history of being on psychiatric medications but did not regularly take his medications.  He reports that he remembers slashing his last prescriptions down the toilet.  Patient does not remember if the medications were effective or not but states that they did make him sleepy shortly after taking them.  Patient endorses depressive symptoms as well as anxiety.  Patient has a history of 2 aunts in the family manage symptoms: going multiple days without sleep, extravagant spending, and racing thoughts.  Patient also endorses a past history of auditory and visual hallucinations.  He further endorses paranoia stating that he feels that people are out to kill him.  Patient does not remember the last psychiatric medications he was on.  Provider recommended Seroquel 50 mg at bedtime for 6 days followed by 100 mg at bedtime for 6 more days, then 150 mg at bedtime for mood stability and the management of his depressive symptoms.  Provider also recommended hydroxyzine 10 mg 3 times daily as needed for the management of his anxiety.  Patient was agreeable to recommendations.  Patient's medications to be prescribed through pharmacy of choice.  Provider to set patient up with a licensed clinical social worker at this facility.  Collaboration of Care: Medication Management AEB provider managing patient's psychiatric medications, Psychiatrist AEB patient being seen by mental health provider at this facility, and Referral or follow-up  with counselor/therapist AEB patient to be set up with a licensed clinical social worker at this facility  Patient/Guardian was advised Release of Information must be obtained prior to any record release in order to collaborate their care with an outside provider. Patient/Guardian was advised if they have not already done so to contact the registration department to sign all necessary forms in order for [place] to release information regarding their care.   Consent: Patient/Guardian gives verbal consent for treatment and assignment of benefits for services provided during this visit. Patient/Guardian expressed understanding and agreed to proceed.    1. Bipolar I disorder, most recent episode depressed (HCC) (Primary)  - QUEtiapine (SEROQUEL) 50 MG tablet; Take 1 tablet (50 mg total) by  mouth at bedtime for 6 days, THEN 2 tablets (100 mg total) at bedtime for 6 days, THEN 3 tablets (150 mg total) at bedtime for 18 days.  Dispense: 72 tablet; Refill: 0 - QUEtiapine Fumarate 150 MG TABS; Take 150 mg by mouth at bedtime.  Dispense: 30 tablet; Refill: 1  2. Severe cannabis use disorder (HCC)  3. Anxiety state  - hydrOXYzine (ATARAX) 10 MG tablet; Take 1 tablet (10 mg total) by mouth 3 (three) times daily as needed.  Dispense: 75 tablet; Refill: 1  Patient to follow up in 6 weeks Provider spent a total of 43 minutes with the patient/RN patient's chart  Meta Hatchet, PA 1/29/202511:34 AM

## 2023-10-02 MED ORDER — QUETIAPINE FUMARATE 150 MG PO TABS: 150.0000 mg | ORAL_TABLET | Freq: Every day | ORAL | 1 refills | Status: AC

## 2023-10-04 ENCOUNTER — Encounter (HOSPITAL_COMMUNITY): Payer: Self-pay | Admitting: Physician Assistant

## 2023-10-10 ENCOUNTER — Telehealth (HOSPITAL_COMMUNITY): Payer: Self-pay | Admitting: *Deleted

## 2023-10-10 NOTE — Telephone Encounter (Signed)
 Fax received for PA of Quetiapine  50mg . Submitted and approved on cover my meds. Called to notify pharmacy.

## 2023-10-14 ENCOUNTER — Telehealth (HOSPITAL_COMMUNITY): Payer: Self-pay | Admitting: *Deleted

## 2023-10-14 NOTE — Telephone Encounter (Signed)
Pt's mother called [facility] requesting to speak with provider about's pt's Seroquel. Her name is [employer] [phone]. Please review.

## 2023-10-28 ENCOUNTER — Telehealth (HOSPITAL_COMMUNITY): Payer: Self-pay | Admitting: Licensed Clinical Social Worker

## 2023-10-28 NOTE — Telephone Encounter (Signed)
 The therapist receives a request for an update on this patient from [name], B.S., CADC-I, [facility] Care Manager and sends the following, secure email:  "We have still had no contact with Larry [name], nor does he currently have any appointments scheduled with [place]."   Larry Blazer, MA, LCSW, [facility], LCAS 10/28/2023

## 2023-11-13 ENCOUNTER — Encounter (HOSPITAL_COMMUNITY): Payer: Self-pay | Admitting: Physician Assistant

## 2023-11-13 ENCOUNTER — Ambulatory Visit (INDEPENDENT_AMBULATORY_CARE_PROVIDER_SITE_OTHER): Payer: No Typology Code available for payment source | Admitting: Physician Assistant

## 2023-11-13 VITALS — BP 123/80 | HR 51 | Temp 97.3°F | Ht 72.0 in | Wt 178.2 lb

## 2023-11-13 DIAGNOSIS — F331 Major depressive disorder, recurrent, moderate: Secondary | ICD-10-CM | POA: Diagnosis not present

## 2023-11-13 DIAGNOSIS — F1220 Cannabis dependence, uncomplicated: Secondary | ICD-10-CM

## 2023-11-13 DIAGNOSIS — F411 Generalized anxiety disorder: Secondary | ICD-10-CM

## 2023-11-13 MED ORDER — MIRTAZAPINE 7.5 MG PO TABS: 7.5000 mg | ORAL_TABLET | Freq: Every day | ORAL | 1 refills | Status: DC

## 2023-11-13 MED ORDER — HYDROXYZINE HCL 10 MG PO TABS: 10.0000 mg | ORAL_TABLET | Freq: Three times a day (TID) | ORAL | 1 refills | Status: DC | PRN

## 2023-11-13 NOTE — Progress Notes (Signed)
 BH MD/PA/NP OP Progress Note  11/13/2023 2:52 PM Gus Puma.  MRN:  [id number]  Chief Complaint:  Chief Complaint  Patient presents with   Follow-up   Medication Management   HPI:   Joao Mccurdy. Keyante Durio. is a 28 year old male with a past psychiatric history significant for possible bipolar disorder, anxiety, and severe cannabis disorder who presents to [facility] for follow-up and medication management.  During his last encounter, patient was being managed on the following psychiatric medications:  Seroquel 100 mg daily Hydroxyzine 10 mg 3 times daily as needed  Patient presents today encounter continuing to endorse depression.  Patient rates his depression as 7 out of 10 with 10 being most severe.  Patient endorses depressive episodes every day he wakes up.  Patient reports that he is often irritable every time he wakes up.  Patient endorses the following depressive symptoms: lack of motivation, decreased concentration, irritability, decreased energy, hopelessness, and excessive worrying.  Patient denies feelings of sadness or feelings of guilt/worthlessness.  Patient endorses anxiety and rates his anxiety as 5 out of 10.  A PHQ-9 screen was performed with the patient scoring a 22.  A GAD-7 screen was also performed with the patient scoring an 18.  Patient is alert and oriented x 4, calm, cooperative, and fully engaged in conversation during the encounter.  Patient endorses neutral mood.  Patient exhibits depressed mood with appropriate affect.  Patient denies suicidal or homicidal ideations.  He further denies auditory or visual hallucinations and does not appear to be responding to internal/external stimuli.  Patient endorses fair sleep and receives on average 5 hours of sleep per night.  Patient endorses decreased appetite and eats on average 1 meal per day.  Patient denies alcohol consumption.  Patient endorses tobacco use and smokes on average 5  cigarettes/day.  Patient endorses illicit drug use in the form of marijuana.  Visit Diagnosis:    ICD-10-CM   1. Severe cannabis use disorder (HCC)  F12.20     2. Moderate episode of recurrent major depressive disorder (HCC)  F33.1 mirtazapine (REMERON) 7.5 MG tablet    3. Anxiety state  F41.1 mirtazapine (REMERON) 7.5 MG tablet    hydrOXYzine (ATARAX) 10 MG tablet      Past Psychiatric History:  Patient endorses a past psychiatric history significant for schizophrenia and bipolar disorder   Patient endorses a past history of hospitalization due to mental health.  He reports that he was last hospitalized at [facility] when diagnosed with schizophrenia.   Patient endorses a past history of suicide attempt stating that he once tried to jump out of a moving car   Patient denies a past history of homicide attempt  Past Medical History:  Past Medical History:  Diagnosis Date   Bipolar 1 disorder (HCC)    History reviewed. No pertinent surgical history.  Family Psychiatric History:  Patient is unsure of family history of psychiatric illness   Family history of suicide attempt: Patient denies Family history of homicide attempt: Patient denies Family history of substance abuse: Patient reports some of his family members smoked marijuana  Family History: History reviewed. No pertinent family history.  Social History:  Social History   Socioeconomic History   Marital status: Single    Spouse name: Not on file   Number of children: Not on file   Years of education: Not on file   Highest education level: Not on file  Occupational History  Not on file  Tobacco Use   Smoking status: Every Day    Types: Cigarettes   Smokeless tobacco: Never  Substance and Sexual Activity   Alcohol use: No   Drug use: Yes    Types: Marijuana   Sexual activity: Yes  Other Topics Concern   Not on file  Social History Narrative   Not on file   Social Drivers of Health    Financial Resource Strain: Not on file  Food Insecurity: Not on file  Transportation Needs: Not on file  Physical Activity: Not on file  Stress: Not on file  Social Connections: Not on file    Allergies:  Allergies  Allergen Reactions   Zithromax [Azithromycin Dihydrate] Rash    Metabolic Disorder Labs: Lab Results  Component Value Date   HGBA1C 5.4 11/23/2020   MPG 108.28 11/23/2020   No results found for: "PROLACTIN" Lab Results  Component Value Date   CHOL 131 11/23/2020   TRIG 58 11/23/2020   HDL 34 (L) 11/23/2020   CHOLHDL 3.9 11/23/2020   VLDL 12 11/23/2020   LDLCALC 85 11/23/2020   Lab Results  Component Value Date   TSH 2.097 11/23/2020    Therapeutic Level Labs: No results found for: "LITHIUM" Lab Results  Component Value Date   VALPROATE 42 (L) 05/29/2018   No results found for: "CBMZ"  Current Medications: Current Outpatient Medications  Medication Sig Dispense Refill   mirtazapine (REMERON) 7.5 MG tablet Take 1 tablet (7.5 mg total) by mouth at bedtime. 30 tablet 1   hydrOXYzine (ATARAX) 10 MG tablet Take 1 tablet (10 mg total) by mouth 3 (three) times daily as needed. 75 tablet 1   QUEtiapine (SEROQUEL) 50 MG tablet Take 1 tablet (50 mg total) by mouth at bedtime for 6 days, THEN 2 tablets (100 mg total) at bedtime for 6 days, THEN 3 tablets (150 mg total) at bedtime for 18 days. 72 tablet 0   QUEtiapine Fumarate 150 MG TABS Take 150 mg by mouth at bedtime. 30 tablet 1   No current facility-administered medications for this visit.     Musculoskeletal: Strength & Muscle Tone: within normal limits Gait & Station: normal Patient leans: N/A  Psychiatric Specialty Exam: Review of Systems  Psychiatric/Behavioral:  Positive for dysphoric mood and sleep disturbance. Negative for decreased concentration, hallucinations, self-injury and suicidal ideas. The patient is nervous/anxious. The patient is not hyperactive.     Blood pressure 123/80,  pulse (!) 51, temperature (!) 97.3 F (36.3 C), temperature source Oral, height 6' (1.829 m), weight 178 lb 3.2 oz (80.8 kg), SpO2 99%.Body mass index is 24.17 kg/m.  General Appearance: Casual  Eye Contact:  Good  Speech:  Clear and Coherent and Normal Rate  Volume:  Normal  Mood:  Anxious and Depressed  Affect:  Congruent  Thought Process:  Coherent, Goal Directed, and Descriptions of Associations: Intact  Orientation:  Full (Time, Place, and Person)  Thought Content: WDL   Suicidal Thoughts:  No  Homicidal Thoughts:  No  Memory:  Immediate;   Good Recent;   Good Remote;   Fair  Judgement:  Fair  Insight:  Present  Psychomotor Activity:  Normal  Concentration:  Concentration: Good and Attention Span: Good  Recall:  Good  Fund of Knowledge: Good  Language: Good  Akathisia:  No  Handed:  Right  AIMS (if indicated): not done  Assets:  Communication Skills Desire for Improvement Physical Health Transportation  ADL's:  Intact  Cognition: WNL  Sleep:  Fair   Screenings: AIMS    Flowsheet Row [facility]  AIMS Total Score 0      AUDIT    Flowsheet Row [facility]  Alcohol Use Disorder Identification Test Final Score (AUDIT) 0      GAD-7    Flowsheet Row Clinical Support from 11/13/2023 in [facility] Office Visit from 10/01/2023 in [facility]  Total GAD-7 Score 18 18      PHQ2-9    Flowsheet Row Clinical Support from 11/13/2023 in [facility] Office Visit from 10/01/2023 in [facility] ED from 08/27/2020 in [facility]  PHQ-2 Total Score 6 6 6   PHQ-9 Total Score 22 23 27       Flowsheet Row Clinical Support from 11/13/2023 in [facility] Office  Visit from 10/01/2023 in [facility] ED from [facility]/23/2022 in [facility] at [facility]  C-SSRS RISK CATEGORY Moderate Risk Moderate Risk No Risk        Assessment and Plan:   Toniann Fail Kenith Trickel. is a 28 year old male with a past psychiatric history significant for possible bipolar disorder, anxiety, and severe cannabis disorder who presents to [facility] for follow-up and medication management.  Patient presents today encounter continuing to endorse worsening depression as well as anxiety.  Patient informed provider that he was unable to pick up his Seroquel due to the instructions on the prescription being written incorrectly.  Patient reports that he does uses hydroxyzine and states that it has been somewhat effective in managing his anxiety.  During the assessment, provider assess patient for past history of manic episodes.  During the assessment, it did not appear that patient experienced having a true manic episode in the past.  Due to lack of history of manic episodes, it would appear that patient's symptoms are more correlated to major depressive disorder.  Instead of placing patient on Seroquel, provider recommended mirtazapine 7.5 mg at bedtime for the management of his depressive symptoms, anxiety, and for sleep.  Patient was agreeable to recommendation.  Patient's medications to be e-prescribed to pharmacy of choice.  Collaboration of Care: Collaboration of Care: Medication Management AEB provider managing patient's psychiatric medications, Psychiatrist AEB patient being followed by a mental health provider at this facility, and Referral or follow-up with counselor/therapist AEB patient being seen by licensed clinical social worker at this facility  Patient/Guardian was advised Release of Information must be obtained prior to any record release in order to collaborate their care with an outside  provider. Patient/Guardian was advised if they have not already done so to contact the registration department to sign all necessary forms in order for [place] to release information regarding their care.   Consent: Patient/Guardian gives verbal consent for treatment and assignment of benefits for services provided during this visit. Patient/Guardian expressed understanding and agreed to proceed.   1. Moderate episode of recurrent major depressive disorder (HCC)  - mirtazapine (REMERON) 7.5 MG tablet; Take 1 tablet (7.5 mg total) by mouth at bedtime.  Dispense: 30 tablet; Refill: 1  2. Anxiety state  - mirtazapine (REMERON) 7.5 MG tablet; Take 1 tablet (7.5 mg total) by mouth at bedtime.  Dispense: 30 tablet; Refill: 1 - hydrOXYzine (ATARAX) 10 MG tablet; Take 1 tablet (  10 mg total) by mouth 3 (three) times daily as needed.  Dispense: 75 tablet; Refill: 1  3. Severe cannabis use disorder (HCC) (Primary)  Patient to follow up in 6 weeks Provider spent a total of 18 minutes with the patient/reviewing the patient's chart  Meta Hatchet, PA 11/13/2023, 2:52 PM

## 2023-12-22 ENCOUNTER — Ambulatory Visit (HOSPITAL_COMMUNITY): Payer: No Typology Code available for payment source | Admitting: Clinical

## 2023-12-25 ENCOUNTER — Encounter (HOSPITAL_COMMUNITY): Payer: Self-pay | Admitting: Physician Assistant

## 2023-12-25 ENCOUNTER — Ambulatory Visit (INDEPENDENT_AMBULATORY_CARE_PROVIDER_SITE_OTHER): Admitting: Physician Assistant

## 2023-12-25 VITALS — BP 134/82 | HR 52 | Temp 97.9°F | Ht 72.0 in | Wt 173.8 lb

## 2023-12-25 DIAGNOSIS — F1220 Cannabis dependence, uncomplicated: Secondary | ICD-10-CM

## 2023-12-25 DIAGNOSIS — F411 Generalized anxiety disorder: Secondary | ICD-10-CM | POA: Diagnosis not present

## 2023-12-25 DIAGNOSIS — F331 Major depressive disorder, recurrent, moderate: Secondary | ICD-10-CM

## 2023-12-25 MED ORDER — HYDROXYZINE HCL 10 MG PO TABS: 10.0000 mg | ORAL_TABLET | Freq: Three times a day (TID) | ORAL | 1 refills | Status: DC | PRN

## 2023-12-25 MED ORDER — MIRTAZAPINE 7.5 MG PO TABS: 7.5000 mg | ORAL_TABLET | Freq: Every day | ORAL | 1 refills | Status: DC

## 2023-12-25 NOTE — Progress Notes (Signed)
 BH MD/PA/NP OP Progress Note  12/25/2023 3:58 PM [name].  MRN:  [id number]  Chief Complaint:  Chief Complaint  Patient presents with   Follow-up   Medication Refill   HPI:   Larry [name] Larry [name] is a 28 year old male with a past psychiatric history significant for possible bipolar disorder, anxiety, and severe cannabis disorder who presents to [facility] for follow-up and medication management.  During his last encounter, patient was being managed on the following psychiatric medications:  Mirtazapine  7.5 mg at bedtime Hydroxyzine  10 mg 3 times daily as needed  Patient presents to the encounter reporting no issues or concerns on his current medication regimen.  He reports that he does not take his mirtazapine  regularly but denies experiencing any adverse side effects when he does take the medication.  Patient reports that he takes his mirtazapine  during the daytime and that makes him drowsy.  Patient was instructed to take mirtazapine  before bedtime.  Patient vocalized understanding.  Patient denies experiencing manic symptoms.  He denies increased energy/activity, elevated mood or irritability, decreased need for sleep, or impulsive behavior.  He does endorse racing thoughts on occasion.  Patient continues to endorse depression and rates his depression as 6 out of 10 with 10 being most severe.  Patient endorses depressive episodes 3 days out of the week.  Patient endorses the following depressive symptoms: lack of motivation, irritability, decreased energy, feelings of guilt/worthlessness, and hopelessness.  He denies feelings of sadness or decreased concentration.  Patient denies anxiety but does endorse stressors revolving around past charges.  Patient states that his next court date for his charges occurred from June 22nd.  A PHQ-9 screen was performed with the patient scoring a 15.  A GAD-7 screen was also performed with the patient scoring a  17.  Patient is alert and oriented x 4, calm, cooperative, and fully engaged in conversation during the encounter.  Patient endorses normal mood.  Patient exhibits depressed mood with appropriate affect.  Patient denies suicidal or homicidal ideations.  He further denies auditory or visual hallucinations and does not appear to be responding to internal/external stimuli.  Patient endorses fair sleep and receives on average 5 hours of sleep per night.  Patient endorses good appetite needs on average 3 meals per day.  Patient denies alcohol consumption.  Patient endorses tobacco use and smokes on average 3 cigarettes/day.  Patient endorses illicit drug use in the form of marijuana.  Visit Diagnosis:    ICD-10-CM   1. Moderate episode of recurrent major depressive disorder (HCC)  F33.1 mirtazapine  (REMERON ) 7.5 MG tablet    2. Anxiety state  F41.1 mirtazapine  (REMERON ) 7.5 MG tablet    hydrOXYzine  (ATARAX ) 10 MG tablet      Past Psychiatric History:  Patient endorses a past psychiatric history significant for schizophrenia and bipolar disorder   Patient endorses a past history of hospitalization due to mental health.  He reports that he was last hospitalized at [facility] when diagnosed with schizophrenia.   Patient endorses a past history of suicide attempt stating that he once tried to jump out of a moving car   Patient denies a past history of homicide attempt  Past Medical History:  Past Medical History:  Diagnosis Date   Bipolar 1 disorder (HCC)    History reviewed. No pertinent surgical history.  Family Psychiatric History:  Patient is unsure of family history of psychiatric illness   Family history of suicide attempt: Patient  denies Family history of homicide attempt: Patient denies Family history of substance abuse: Patient reports some of his family members smoked marijuana  Family History: History reviewed. No pertinent family history.  Social History:  Social  History   Socioeconomic History   Marital status: Single    Spouse name: Not on file   Number of children: Not on file   Years of education: Not on file   Highest education level: Not on file  Occupational History   Not on file  Tobacco Use   Smoking status: Every Day    Types: Cigarettes   Smokeless tobacco: Never  Substance and Sexual Activity   Alcohol use: No   Drug use: Yes    Types: Marijuana   Sexual activity: Yes  Other Topics Concern   Not on file  Social History Narrative   Not on file   Social Drivers of Health   Financial Resource Strain: Not on file  Food Insecurity: Not on file  Transportation Needs: Not on file  Physical Activity: Not on file  Stress: Not on file  Social Connections: Not on file    Allergies:  Allergies  Allergen Reactions   Zithromax [Azithromycin Dihydrate] Rash    Metabolic Disorder Labs: Lab Results  Component Value Date   HGBA1C 5.4 11/23/2020   MPG 108.28 11/23/2020   No results found for: "PROLACTIN" Lab Results  Component Value Date   CHOL 131 11/23/2020   TRIG 58 11/23/2020   HDL 34 (L) 11/23/2020   CHOLHDL 3.9 11/23/2020   VLDL 12 11/23/2020   LDLCALC 85 11/23/2020   Lab Results  Component Value Date   TSH 2.097 11/23/2020    Therapeutic Level Labs: No results found for: "LITHIUM" Lab Results  Component Value Date   VALPROATE 42 (L) 05/29/2018   No results found for: "CBMZ"  Current Medications: Current Outpatient Medications  Medication Sig Dispense Refill   hydrOXYzine  (ATARAX ) 10 MG tablet Take 1 tablet (10 mg total) by mouth 3 (three) times daily as needed. 75 tablet 1   mirtazapine  (REMERON ) 7.5 MG tablet Take 1 tablet (7.5 mg total) by mouth at bedtime. 30 tablet 1   QUEtiapine  (SEROQUEL ) 50 MG tablet Take 1 tablet (50 mg total) by mouth at bedtime for 6 days, THEN 2 tablets (100 mg total) at bedtime for 6 days, THEN 3 tablets (150 mg total) at bedtime for 18 days. 72 tablet 0   QUEtiapine   Fumarate 150 MG TABS Take 150 mg by mouth at bedtime. 30 tablet 1   No current facility-administered medications for this visit.     Musculoskeletal: Strength & Muscle Tone: within normal limits Gait & Station: normal Patient leans: N/A  Psychiatric Specialty Exam: Review of Systems  Psychiatric/Behavioral:  Positive for dysphoric mood and sleep disturbance. Negative for decreased concentration, hallucinations, self-injury and suicidal ideas. The patient is nervous/anxious. The patient is not hyperactive.     Blood pressure 134/82, pulse (!) 52, temperature 97.9 F (36.6 C), temperature source Oral, height 6' (1.829 m), weight 173 lb 12.8 oz (78.8 kg), SpO2 100%.Body mass index is 23.57 kg/m.  General Appearance: Casual  Eye Contact:  Good  Speech:  Clear and Coherent and Normal Rate  Volume:  Normal  Mood:  Anxious and Depressed  Affect:  Congruent  Thought Process:  Coherent, Goal Directed, and Descriptions of Associations: Intact  Orientation:  Full (Time, Place, and Person)  Thought Content: WDL   Suicidal Thoughts:  No  Homicidal Thoughts:  No  Memory:  Immediate;   Good Recent;   Good Remote;   Fair  Judgement:  Fair  Insight:  Present  Psychomotor Activity:  Normal  Concentration:  Concentration: Good and Attention Span: Good  Recall:  Good  Fund of Knowledge: Good  Language: Good  Akathisia:  No  Handed:  Right  AIMS (if indicated): not done  Assets:  Communication Skills Desire for Improvement Physical Health Transportation  ADL's:  Intact  Cognition: WNL  Sleep:  Fair   Screenings: AIMS    Flowsheet Row [facility]  AIMS Total Score 0      AUDIT    Flowsheet Row Ad[facility]  Alcohol Use Disorder Identification Test Final Score (AUDIT) 0      GAD-7    Flowsheet Row Clinical Support from  12/25/2023 in [facility] Clinical Support from 11/13/2023 in [facility] Office Visit from 10/01/2023 in [facility]  Total GAD-7 Score 17 18 18       PHQ2-9    Flowsheet Row Clinical Support from 12/25/2023 in [facility] Clinical Support from 11/13/2023 in [facility] Office Visit from 10/01/2023 in [facility] from 08/27/2020 in [facility]  PHQ-2 Total Score 3 6 6 6   PHQ-9 Total Score 15 22 23 27       Flowsheet Row Clinical Support from 12/25/2023 in [facility] Clinical Support from 11/13/2023 in [facility] Office Visit from 10/01/2023 in [facility]  C-SSRS RISK CATEGORY Moderate Risk Moderate Risk Moderate Risk        Assessment and Plan:   Larry [name] is a 28 year old male with a past psychiatric history significant for possible bipolar disorder, anxiety, and severe cannabis disorder who presents to [facility] for follow-up and medication management.  Patient presents to the encounter stating manage he has not been taking his mirtazapine  regularly.  He also reports that he has been taking his mirtazapine  in the morning and that it has been causing him drowsiness.  Provider instructed patient to take mirtazapine  regularly at bedtime for the management of his depressive symptoms and for sleep.  Patient vocalized understanding.  When he does remember to take his mirtazapine , patient denies experiencing any adverse side effects.  Patient denies experiencing manic symptoms at this time.  He continues to endorse some depression but denies anxiety.  A PHQ-9 screen was performed the patient scored a 15.  A GAD-7 screen was also performed with the patient scoring a  17.  Based off his PHQ-9 and GAD-7 screen, patient continues to exhibit some depression and anxiety but denies the need for dosage adjustments at this time.  Patient reports that he would like to continue taking his medications as prescribed and states that he will start taking his mirtazapine  regularly at bedtime.  Patient's medications to be e-prescribed to pharmacy of choice.  A [place] Suicide Severity Rating Scale was performed with the patient being considered moderate risk.  Patient denies suicidal ideations and is able to contract for safety following the completion of the encounter.  Collaboration of Care: Collaboration of Care: Medication Management AEB provider managing patient's psychiatric medications, Psychiatrist AEB patient being followed by a mental health provider at this facility, and Referral  or follow-up with counselor/therapist AEB patient being seen by licensed clinical social worker at this facility  Patient/Guardian was advised Release of Information must be obtained prior to any record release in order to collaborate their care with an outside provider. Patient/Guardian was advised if they have not already done so to contact the registration department to sign all necessary forms in order for us  to release information regarding their care.   Consent: Patient/Guardian gives verbal consent for treatment and assignment of benefits for services provided during this visit. Patient/Guardian expressed understanding and agreed to proceed.   1. Moderate episode of recurrent major depressive disorder (HCC)  - mirtazapine  (REMERON ) 7.5 MG tablet; Take 1 tablet (7.5 mg total) by mouth at bedtime.  Dispense: 30 tablet; Refill: 1  2. Anxiety state  - mirtazapine  (REMERON ) 7.5 MG tablet; Take 1 tablet (7.5 mg total) by mouth at bedtime.  Dispense: 30 tablet; Refill: 1 - hydrOXYzine  (ATARAX ) 10 MG tablet; Take 1 tablet (10 mg total) by mouth 3 (three) times daily as needed.  Dispense: 75  tablet; Refill: 1  3. Severe cannabis use disorder (HCC) (Primary)  Patient to follow up in 6 weeks Provider spent a total of 19 minutes with the patient/reviewing the patient's chart  Gates Kasal, PA 12/25/2023, 3:58 PM

## 2024-01-27 ENCOUNTER — Telehealth (HOSPITAL_COMMUNITY): Payer: Self-pay | Admitting: Licensed Clinical Social Worker

## 2024-01-27 NOTE — Telephone Encounter (Signed)
 The therapist receives a ROI from [name], B.S., CADC-I with [facility] and sends the following, secure email:  "Mr. Larry [name] has been seen by Mr. Larry Maxin, PA for medication management on 10/01/23, 11/16/23, and 12/25/23. He is scheduled to meet with him again on 02/05/24. Mr. Larry, [name] is currently receiving medication management only."   Melynda Stagger, [place], [place], [facility], LCAS 01/27/2024

## 2024-02-05 ENCOUNTER — Ambulatory Visit (HOSPITAL_COMMUNITY): Admitting: Physician Assistant

## 2024-02-05 ENCOUNTER — Encounter (HOSPITAL_COMMUNITY): Payer: Self-pay | Admitting: Physician Assistant

## 2024-02-05 VITALS — BP 132/79 | HR 62 | Temp 98.2°F | Ht 72.0 in | Wt 182.0 lb

## 2024-02-05 DIAGNOSIS — F411 Generalized anxiety disorder: Secondary | ICD-10-CM | POA: Diagnosis not present

## 2024-02-05 DIAGNOSIS — F1220 Cannabis dependence, uncomplicated: Secondary | ICD-10-CM | POA: Diagnosis not present

## 2024-02-05 DIAGNOSIS — F331 Major depressive disorder, recurrent, moderate: Secondary | ICD-10-CM

## 2024-02-05 MED ORDER — MIRTAZAPINE 15 MG PO TABS: 15.0000 mg | ORAL_TABLET | Freq: Every day | ORAL | 1 refills | Status: DC

## 2024-02-05 MED ORDER — HYDROXYZINE HCL 25 MG PO TABS: 25.0000 mg | ORAL_TABLET | Freq: Three times a day (TID) | ORAL | 1 refills | Status: DC | PRN

## 2024-02-05 NOTE — Progress Notes (Signed)
 BH MD/PA/NP OP Progress Note  02/05/2024 7:46 PM Larry [name]  MRN:  [id number]  Chief Complaint:  Chief Complaint  Patient presents with   Follow-up   Medication Management   HPI:   Larry [name] Larry [name] is a 28 year old male with a past psychiatric history significant for possible bipolar disorder, anxiety, and severe cannabis disorder who presents to [facility] for follow-up and medication management.  During his last encounter, patient was being managed on the following psychiatric medications:  Mirtazapine  7.5 mg at bedtime Hydroxyzine  10 mg 3 times daily as needed  Patient reports that he is a little bothered by the recent development that his friend's sister was recently killed.  He reports that he would like to increase his dosage of mirtazapine  and states that he has been taking double the dosage of the medication lately.  He reports that using the medication allows him to sleep.  Patient denies overt depressive symptoms at this time and states that he has been feeling really good lately.  When asked about manic symptoms, patient denied instances of euphoria, irritability, financial extravagance, or engaging in risky behavior.  Patient continues to endorse anxiety and rates his anxiety an 8 out of 10.  Patient attributes his anxiety to lack of employment.  A PHQ-9 screen was performed with the patient scoring an 11.  A GAD-7 screen was also performed with the patient scoring a 12.  Patient is alert and oriented x 4, calm, cooperative, and fully engaged in conversation during the encounter.  Patient endorses good mood.  Patient exhibits euthymic mood with appropriate affect.  Patient denies suicidal or homicidal ideations.  He further denies auditory or visual hallucinations and does not appear to be responding to internal/external stimuli.  Patient endorses good sleep and receives on average to 8 hours of sleep per night.  Patient endorses good  appetite needs on average 3 meals per day.  Patient denies alcohol consumption.  He endorses tobacco use and smokes on average 3 cigarettes/day.  Patient endorses illicit drug use and states that he smokes marijuana once every 2 days.  Visit Diagnosis:    ICD-10-CM   1. Moderate episode of recurrent major depressive disorder (HCC)  F33.1 mirtazapine  (REMERON ) 15 MG tablet    2. Anxiety state  F41.1 mirtazapine  (REMERON ) 15 MG tablet    hydrOXYzine  (ATARAX ) 25 MG tablet      Past Psychiatric History:  Patient endorses a past psychiatric history significant for schizophrenia and bipolar disorder   Patient endorses a past history of hospitalization due to mental health.  He reports that he was last hospitalized at [facility] when diagnosed with schizophrenia.   Patient endorses a past history of suicide attempt stating that he once tried to jump out of a moving car   Patient denies a past history of homicide attempt  Past Medical History:  Past Medical History:  Diagnosis Date   Bipolar 1 disorder (HCC)    History reviewed. No pertinent surgical history.  Family Psychiatric History:  Patient is unsure of family history of psychiatric illness   Family history of suicide attempt: Patient denies Family history of homicide attempt: Patient denies Family history of substance abuse: Patient reports some of his family members smoked marijuana  Family History: History reviewed. No pertinent family history.  Social History:  Social History   Socioeconomic History   Marital status: Single    Spouse name: Not on file   Number of  children: Not on file   Years of education: Not on file   Highest education level: Not on file  Occupational History   Not on file  Tobacco Use   Smoking status: Every Day    Types: Cigarettes   Smokeless tobacco: Never  Substance and Sexual Activity   Alcohol use: No   Drug use: Yes    Types: Marijuana   Sexual activity: Yes  Other Topics  Concern   Not on file  Social History Narrative   Not on file   Social Drivers of Health   Financial Resource Strain: Not on file  Food Insecurity: Not on file  Transportation Needs: Not on file  Physical Activity: Not on file  Stress: Not on file  Social Connections: Not on file    Allergies:  Allergies  Allergen Reactions   Zithromax [Azithromycin Dihydrate] Rash    Metabolic Disorder Labs: Lab Results  Component Value Date   HGBA1C 5.4 11/23/2020   MPG 108.28 11/23/2020   No results found for: PROLACTIN Lab Results  Component Value Date   CHOL 131 11/23/2020   TRIG 58 11/23/2020   HDL 34 (L) 11/23/2020   CHOLHDL 3.9 11/23/2020   VLDL 12 11/23/2020   LDLCALC 85 11/23/2020   Lab Results  Component Value Date   TSH 2.097 11/23/2020    Therapeutic Level Labs: No results found for: LITHIUM Lab Results  Component Value Date   VALPROATE 42 (L) 05/29/2018   No results found for: CBMZ  Current Medications: Current Outpatient Medications  Medication Sig Dispense Refill   hydrOXYzine  (ATARAX ) 25 MG tablet Take 1 tablet (25 mg total) by mouth 3 (three) times daily as needed. 75 tablet 1   mirtazapine  (REMERON ) 15 MG tablet Take 1 tablet (15 mg total) by mouth at bedtime. 30 tablet 1   QUEtiapine  (SEROQUEL ) 50 MG tablet Take 1 tablet (50 mg total) by mouth at bedtime for 6 days, THEN 2 tablets (100 mg total) at bedtime for 6 days, THEN 3 tablets (150 mg total) at bedtime for 18 days. 72 tablet 0   QUEtiapine  Fumarate 150 MG TABS Take 150 mg by mouth at bedtime. 30 tablet 1   No current facility-administered medications for this visit.     Musculoskeletal: Strength & Muscle Tone: within normal limits Gait & Station: normal Patient leans: N/A  Psychiatric Specialty Exam: Review of Systems  Psychiatric/Behavioral:  Positive for dysphoric mood and sleep disturbance. Negative for decreased concentration, hallucinations, self-injury and suicidal ideas. The  patient is nervous/anxious. The patient is not hyperactive.     Blood pressure 132/79, pulse 62, temperature 98.2 F (36.8 C), temperature source Oral, height 6' (1.829 m), weight 182 lb (82.6 kg), SpO2 100%.Body mass index is 24.68 kg/m.  General Appearance: Casual  Eye Contact:  Good  Speech:  Clear and Coherent and Normal Rate  Volume:  Normal  Mood:  Anxious and Depressed  Affect:  Congruent  Thought Process:  Coherent, Goal Directed, and Descriptions of Associations: Intact  Orientation:  Full (Time, Place, and Person)  Thought Content: WDL   Suicidal Thoughts:  No  Homicidal Thoughts:  No  Memory:  Immediate;   Good Recent;   Good Remote;   Fair  Judgement:  Fair  Insight:  Present  Psychomotor Activity:  Normal  Concentration:  Concentration: Good and Attention Span: Good  Recall:  Good  Fund of Knowledge: Good  Language: Good  Akathisia:  No  Handed:  Right  AIMS (if indicated):  not done  Assets:  Communication Skills Desire for Improvement Physical Health Transportation  ADL's:  Intact  Cognition: WNL  Sleep:  Fair   Screenings: AIMS    Flowsheet Row [facility]  AIMS Total Score 0      AUDIT    Flowsheet Row [facility]  Alcohol Use Disorder Identification Test Final Score (AUDIT) 0      GAD-7    Flowsheet Row Clinical Support from 02/05/2024 in [facility] Clinical Support from 12/25/2023 in [facility] Clinical Support from 11/13/2023 in [facility] Office Visit from 10/01/2023 in [facility]  Total GAD-7 Score 12 17 18 18       PHQ2-9    Flowsheet Row Clinical Support from 02/05/2024 in [facility] Clinical Support from 12/25/2023 in [facility] Clinical Support from 11/13/2023 in [facility] Office Visit from 10/01/2023 in [facility] ED from 08/27/2020 in [facility]  PHQ-2 Total Score 2 3 6 6 6   PHQ-9 Total Score 11 15 22 23 27       Flowsheet Row Clinical Support from 02/05/2024 in [facility] Clinical Support from 12/25/2023 in [facility] Clinical Support from 11/13/2023 in [facility]  C-SSRS RISK CATEGORY Moderate Risk Moderate Risk Moderate Risk        Assessment and Plan:   Larry [name] is a 28 year old male with a past psychiatric history significant for possible bipolar disorder, anxiety, and severe cannabis disorder who presents to [facility] for follow-up and medication management.  Patient presents to the encounter stating that he has been doing well due to his use of mirtazapine .  He reports that he is interested in adjusting the dosage of the medication stating that he is currently taking double the dosage already.  Patient denies overt depressive symptoms however a PHQ-9 screen was performed with the patient scoring an 11.  Patient denied experiencing manic symptoms such as euphoria, irritability, financial extravagance, or engaging in risky behavior.  Patient does continue to endorse anxiety attributing his symptoms to being unemployed.  Provider recommended increasing patient's mirtazapine  dosage from 7.5 mg to 15 mg at bedtime for the management of his depressive symptoms and anxiety.  Provider also recommended increasing patient's hydroxyzine  dosage from 10 mg to 25 mg 3 times daily as needed for the management of his anxiety.  Patient was agreeable to recommendations.  Patient's medications to be e-prescribed to pharmacy of choice.  A [place] Suicide Severity Rating Scale was performed with the  patient being considered moderate risk.  Patient denies suicidal ideations and is able to contract for safety following the completion of the encounter.  Collaboration of Care: Collaboration of Care: Medication Management AEB provider managing patient's psychiatric medications, Psychiatrist AEB patient being followed by a mental health provider at this facility, and Referral or follow-up with counselor/therapist AEB patient being seen by licensed clinical social worker at this facility  Patient/Guardian was advised Release of Information must be obtained prior to any record release in order to collaborate their care with an outside provider. Patient/Guardian was advised if they have not already done so to contact the registration department to sign all necessary forms in order for  us  to release information regarding their care.   Consent: Patient/Guardian gives verbal consent for treatment and assignment of benefits for services provided during this visit. Patient/Guardian expressed understanding and agreed to proceed.   1. Moderate episode of recurrent major depressive disorder (HCC)  - mirtazapine  (REMERON ) 15 MG tablet; Take 1 tablet (15 mg total) by mouth at bedtime.  Dispense: 30 tablet; Refill: 1  2. Anxiety state  - mirtazapine  (REMERON ) 15 MG tablet; Take 1 tablet (15 mg total) by mouth at bedtime.  Dispense: 30 tablet; Refill: 1 - hydrOXYzine  (ATARAX ) 25 MG tablet; Take 1 tablet (25 mg total) by mouth 3 (three) times daily as needed.  Dispense: 75 tablet; Refill: 1  3. Severe cannabis use disorder (HCC) (Primary)  Patient to follow up in 6 weeks Provider spent a total of 19 minutes with the patient/reviewing the patient's chart  Gates Kasal, PA 02/05/2024, 7:46 PM

## 2024-03-25 ENCOUNTER — Ambulatory Visit (HOSPITAL_COMMUNITY): Admitting: Physician Assistant

## 2024-03-25 VITALS — BP 133/86 | HR 55 | Temp 98.4°F | Ht 72.0 in | Wt 190.8 lb

## 2024-03-25 DIAGNOSIS — F1220 Cannabis dependence, uncomplicated: Secondary | ICD-10-CM

## 2024-03-25 DIAGNOSIS — F331 Major depressive disorder, recurrent, moderate: Secondary | ICD-10-CM

## 2024-03-25 DIAGNOSIS — F602 Antisocial personality disorder: Secondary | ICD-10-CM

## 2024-03-25 DIAGNOSIS — F411 Generalized anxiety disorder: Secondary | ICD-10-CM

## 2024-03-26 MED ORDER — MIRTAZAPINE 15 MG PO TABS: 15.0000 mg | ORAL_TABLET | Freq: Every day | ORAL | 1 refills | Status: DC

## 2024-03-26 MED ORDER — HYDROXYZINE HCL 25 MG PO TABS: 25.0000 mg | ORAL_TABLET | Freq: Three times a day (TID) | ORAL | 1 refills | Status: DC | PRN

## 2024-03-26 NOTE — Progress Notes (Signed)
 BH MD/PA/NP OP Progress Note  03/25/2024 11:30 AM Larry LITTIE Dante Mickey.  MRN:  [id number]  Chief Complaint:  No chief complaint on file.  HPI:   Jamai Dolce. Yashua Bracco. is a 28 year old male with a past psychiatric history significant for possible bipolar disorder, anxiety, and severe cannabis disorder who presents to [facility] for follow-up and medication management.  During his last encounter, patient was being managed on the following psychiatric medications:  Mirtazapine  15 mg at bedtime Hydroxyzine  10 mg 3 times daily as needed  Patient presents to the encounter stating that his medications were working really well.  He reports that he feels somewhat drowsy when taking his medication (mirtazapine ).  He reports that the drowsiness will last roughly 30 minutes upon waking up.  Patient denies the following manic symptoms: increased energy, sleep deficits, and grandiosity.  He does endorse feeling overly confident at times and irritable.  Patient continues to endorse depression and rates his depression as 7 out of 10 with 10 being more severe.  He reports that his depression is attributed to not having a support system.  He notes that he has been trying to obtain a job by his mother has been preventing him from getting a job.  He also reports that his mother has been preventing him from obtaining a driver's license.  He reports that people around him have been pissing him off.  He notes being extremely irritable and feels like no one cares about him.  A PHQ-9 screen was performed with the patient scoring an 18.  A GAD-7 screen was also performed with the patient scoring a 17.  Patient is alert and oriented x 4, calm, cooperative, and fully engaged in conversation during the encounter.  Patient endorses having a mixture of emotions.  He exhibits depressed mood with irritable affect.  Patient denies suicidal or homicidal ideations.  He further denies auditory or visual  hallucinations and does not appear to be responding to internal/external stimuli.  Patient endorses fair sleep and receives on average 6 hours of sleep per night.  Patient endorses poor appetite and eats on average 1 meal per day.  Patient denies alcohol consumption.  He endorses tobacco use and smokes on average 3 cigarettes/day.  Patient endorses illicit drug use in the form of marijuana.  Visit Diagnosis:    ICD-10-CM   1. Moderate episode of recurrent major depressive disorder (HCC)  F33.1 mirtazapine  (REMERON ) 15 MG tablet    2. Anxiety state  F41.1 mirtazapine  (REMERON ) 15 MG tablet    hydrOXYzine  (ATARAX ) 25 MG tablet    3. Severe cannabis use disorder (HCC)  F12.20       Past Psychiatric History:  Patient endorses a past psychiatric history significant for schizophrenia and bipolar disorder   Patient endorses a past history of hospitalization due to mental health.  He reports that he was last hospitalized at [facility] when diagnosed with schizophrenia.   Patient endorses a past history of suicide attempt stating that he once tried to jump out of a moving car   Patient denies a past history of homicide attempt  Past Medical History:  Past Medical History:  Diagnosis Date   Bipolar 1 disorder (HCC)    No past surgical history on file.  Family Psychiatric History:  Patient is unsure of family history of psychiatric illness   Family history of suicide attempt: Patient denies Family history of homicide attempt: Patient denies Family history of substance abuse:  Patient reports some of his family members smoked marijuana  Family History: No family history on file.  Social History:  Social History   Socioeconomic History   Marital status: Single    Spouse name: Not on file   Number of children: Not on file   Years of education: Not on file   Highest education level: Not on file  Occupational History   Not on file  Tobacco Use   Smoking status: Every Day     Types: Cigarettes   Smokeless tobacco: Never  Substance and Sexual Activity   Alcohol use: No   Drug use: Yes    Types: Marijuana   Sexual activity: Yes  Other Topics Concern   Not on file  Social History Narrative   Not on file   Social Drivers of Health   Financial Resource Strain: Not on file  Food Insecurity: Not on file  Transportation Needs: Not on file  Physical Activity: Not on file  Stress: Not on file  Social Connections: Not on file    Allergies:  Allergies  Allergen Reactions   Zithromax [Azithromycin Dihydrate] Rash    Metabolic Disorder Labs: Lab Results  Component Value Date   HGBA1C 5.4 11/23/2020   MPG 108.28 11/23/2020   No results found for: PROLACTIN Lab Results  Component Value Date   CHOL 131 11/23/2020   TRIG 58 11/23/2020   HDL 34 (L) 11/23/2020   CHOLHDL 3.9 11/23/2020   VLDL 12 11/23/2020   LDLCALC 85 11/23/2020   Lab Results  Component Value Date   TSH 2.097 11/23/2020    Therapeutic Level Labs: No results found for: LITHIUM Lab Results  Component Value Date   VALPROATE 42 (L) 05/29/2018   No results found for: CBMZ  Current Medications: Current Outpatient Medications  Medication Sig Dispense Refill   hydrOXYzine  (ATARAX ) 25 MG tablet Take 1 tablet (25 mg total) by mouth 3 (three) times daily as needed. 75 tablet 1   mirtazapine  (REMERON ) 15 MG tablet Take 1 tablet (15 mg total) by mouth at bedtime. 30 tablet 1   QUEtiapine  (SEROQUEL ) 50 MG tablet Take 1 tablet (50 mg total) by mouth at bedtime for 6 days, THEN 2 tablets (100 mg total) at bedtime for 6 days, THEN 3 tablets (150 mg total) at bedtime for 18 days. 72 tablet 0   QUEtiapine  Fumarate 150 MG TABS Take 150 mg by mouth at bedtime. 30 tablet 1   No current facility-administered medications for this visit.     Musculoskeletal: Strength & Muscle Tone: within normal limits Gait & Station: normal Patient leans: N/A  Psychiatric Specialty Exam: Review of  Systems  Psychiatric/Behavioral:  Positive for agitation, dysphoric mood and sleep disturbance. Negative for decreased concentration, hallucinations, self-injury and suicidal ideas. The patient is nervous/anxious. The patient is not hyperactive.     There were no vitals taken for this visit.There is no height or weight on file to calculate BMI.  General Appearance: Casual  Eye Contact:  Good  Speech:  Clear and Coherent and Normal Rate  Volume:  Normal  Mood:  Depressed and Irritable  Affect:  Congruent  Thought Process:  Coherent, Goal Directed, and Descriptions of Associations: Intact  Orientation:  Full (Time, Place, and Person)  Thought Content: WDL   Suicidal Thoughts:  No  Homicidal Thoughts:  No  Memory:  Immediate;   Good Recent;   Good Remote;   Fair  Judgement:  Fair  Insight:  Present  Psychomotor Activity:  Normal  Concentration:  Concentration: Good and Attention Span: Good  Recall:  Good  Fund of Knowledge: Good  Language: Good  Akathisia:  No  Handed:  Right  AIMS (if indicated): not done  Assets:  Communication Skills Desire for Improvement Physical Health Transportation  ADL's:  Intact  Cognition: WNL  Sleep:  Fair   Screenings: AIMS    Flowsheet Row [facility]  AIMS Total Score 0   AUDIT    Flowsheet Row Ad[facility] (Dis[facility]  Alcohol Use Disorder Identification Test Final Score (AUDIT) 0   GAD-7    Flowsheet Row Clinical Support from 03/25/2024 in [facility] Clinical Support from 02/05/2024 in [facility] Clinical Support from 12/25/2023 in [facility] Clinical Support from 11/13/2023 in [facility] Office Visit from 10/01/2023 in [facility]  Total GAD-7 Score 17 12 17 18  18    PHQ2-9    Flowsheet Row Clinical Support from 03/25/2024 in [facility] Clinical Support from 02/05/2024 in [facility] Clinical Support from 12/25/2023 in [facility] Clinical Support from 11/13/2023 in [facility] Office Visit from 10/01/2023 in [facility]  PHQ-2 Total Score 5 2 3 6 6   PHQ-9 Total Score 18 11 15 22 23    Flowsheet Row Clinical Support from 03/25/2024 in [facility] Clinical Support from 02/05/2024 in [facility] Clinical Support from 12/25/2023 in [facility]  C-SSRS RISK CATEGORY Moderate Risk Moderate Risk Moderate Risk     Assessment and Plan:   Larry [name] is a 28 year old male with a past psychiatric history significant for possible bipolar disorder, anxiety, and severe cannabis disorder who presents to [facility] for follow-up and medication management.  Patient presents to the encounter stating that he continues to take his medications regularly.  He reports that he experiences drowsiness upon waking up from the use of his mirtazapine .  Despite experiencing this side effect, patient reports that he would like to continue taking his mirtazapine  as prescribed.  During the encounter, patient exhibited irritable mood regarding not being able to work.  He reports that he has been trying to look for work; however, his mom has been preventing him from obtaining a job or getting his license.  Patient endorses irritability over this issues and attributes his depression over the fact.  Provider recommended adjusting his medications accordingly for the management of his depression and anxiety; however, patient refused and requested to continue taking his medications as prescribed.  Patient's medications to be  e-prescribed to pharmacy of choice.  Patient still continues to deny experiencing symptoms of mania such as increased energy, sleep deficits, and grandiosity.  Provider will continue to monitor patient's mood during subsequent encounters.  A [place] Suicide Severity Rating Scale was performed with the patient being considered moderate risk.  Patient denies suicidal ideations and is able to contract for safety at this time.  Safety planning was discussed with the patient prior to the conclusion of the encounter.  - Patient was instructed to contact 911 in the event of a mental health crisis. - Patient was instructed to contact [facility] [employer] in the event of a mental health crisis. -  Patient was instructed to present to [facility] in the event of a mental health crisis.  Collaboration of Care: Collaboration of Care: Medication Management AEB provider managing patient's psychiatric medications, Psychiatrist AEB patient being followed by a mental health provider at this facility, and Referral or follow-up with counselor/therapist AEB patient being seen by licensed clinical social worker at this facility  Patient/Guardian was advised Release of Information must be obtained prior to any record release in order to collaborate their care with an outside provider. Patient/Guardian was advised if they have not already done so to contact the registration department to sign all necessary forms in order for us  to release information regarding their care.   Consent: Patient/Guardian gives verbal consent for treatment and assignment of benefits for services provided during this visit. Patient/Guardian expressed understanding and agreed to proceed.   1. Moderate episode of recurrent major depressive disorder (HCC) (Primary) Major depressive disorder vs Bipolar I disorder  - mirtazapine  (REMERON ) 15 MG tablet; Take 1 tablet (15 mg total) by mouth at bedtime.   Dispense: 30 tablet; Refill: 1  2. Anxiety state  - mirtazapine  (REMERON ) 15 MG tablet; Take 1 tablet (15 mg total) by mouth at bedtime.  Dispense: 30 tablet; Refill: 1 - hydrOXYzine  (ATARAX ) 25 MG tablet; Take 1 tablet (25 mg total) by mouth 3 (three) times daily as needed.  Dispense: 75 tablet; Refill: 1  3. Severe cannabis use disorder [facility])  Patient to follow up in 6 weeks Provider spent a total of 23 minutes with the patient/reviewing the patient's chart  Reginia FORBES Bolster, PA 03/25/2024, 11:30 AM

## 2024-03-27 ENCOUNTER — Encounter (HOSPITAL_COMMUNITY): Payer: Self-pay | Admitting: Physician Assistant

## 2024-04-07 ENCOUNTER — Other Ambulatory Visit (HOSPITAL_COMMUNITY): Payer: Self-pay | Admitting: Physician Assistant

## 2024-04-07 DIAGNOSIS — F411 Generalized anxiety disorder: Secondary | ICD-10-CM

## 2024-05-13 ENCOUNTER — Encounter (HOSPITAL_COMMUNITY): Admitting: Physician Assistant

## 2024-05-19 ENCOUNTER — Ambulatory Visit (HOSPITAL_COMMUNITY): Admitting: Physician Assistant

## 2024-05-19 DIAGNOSIS — F331 Major depressive disorder, recurrent, moderate: Secondary | ICD-10-CM

## 2024-05-19 DIAGNOSIS — F411 Generalized anxiety disorder: Secondary | ICD-10-CM | POA: Diagnosis not present

## 2024-05-21 MED ORDER — HYDROXYZINE HCL 25 MG PO TABS: 25.0000 mg | ORAL_TABLET | Freq: Three times a day (TID) | ORAL | 1 refills | Status: DC | PRN

## 2024-05-21 MED ORDER — MIRTAZAPINE 15 MG PO TABS: 15.0000 mg | ORAL_TABLET | Freq: Every day | ORAL | 1 refills | Status: DC

## 2024-05-21 NOTE — Progress Notes (Signed)
 BH MD/PA/NP OP Progress Note  05/19/2024 2:30 PM Larry [name]  MRN:  [id number]  Chief Complaint:  Chief Complaint  Patient presents with   Follow-up   Medication Refill   HPI:   Larry [name] Larry [name] is a 28 year old male with a past psychiatric history significant for possible bipolar disorder, anxiety, and severe cannabis disorder who presents to [facility] for follow-up and medication management.  During his last encounter, patient was being managed on the following psychiatric medications:  Mirtazapine  15 mg at bedtime Hydroxyzine  10 mg 3 times daily as needed  Patient presents to the encounter stating that he continues to take his medications regularly and denies experiencing any adverse side effects at this time.  Patient denies depressive episodes stating that his symptoms have been manageable since the last encounter.  Patient also denies anxiety.  Patient denies episodes of mania.  Patient reports that he is currently working with a Child psychotherapist and was able to obtain an apartment.  A PHQ-9 screening was performed with the patient scoring a 1.  A GAD-7 screen was also performed with the patient scoring an 8.  Patient is alert and oriented x 4, calm, cooperative, and fully engaged in conversation during the encounter.  Patient endorses good mood.  Patient exhibits euthymic mood with appropriate affect.  Patient denies suicidal or homicidal ideations.  He further denies auditory or visual hallucinations and does not appear to be responding to internal/external stimuli.  Patient endorses good sleep and receives on average 9 to 10 hours of sleep per night.  Patient endorses good appetite and eats on average 3 meals per day.  Patient denies alcohol consumption or illicit drug use.  Patient endorses tobacco use and smokes on average 4 cigarettes/day (Black and Milds).  Visit Diagnosis:    ICD-10-CM   1. Moderate episode of recurrent major  depressive disorder (HCC)  F33.1 mirtazapine  (REMERON ) 15 MG tablet    2. Anxiety state  F41.1 mirtazapine  (REMERON ) 15 MG tablet    hydrOXYzine  (ATARAX ) 25 MG tablet      Past Psychiatric History:  Patient endorses a past psychiatric history significant for schizophrenia and bipolar disorder   Patient endorses a past history of hospitalization due to mental health.  He reports that he was last hospitalized at [facility] when diagnosed with schizophrenia.   Patient endorses a past history of suicide attempt stating that he once tried to jump out of a moving car   Patient denies a past history of homicide attempt  Past Medical History:  Past Medical History:  Diagnosis Date   Bipolar 1 disorder (HCC)    History reviewed. No pertinent surgical history.  Family Psychiatric History:  Patient is unsure of family history of psychiatric illness   Family history of suicide attempt: Patient denies Family history of homicide attempt: Patient denies Family history of substance abuse: Patient reports some of his family members smoked marijuana  Family History: History reviewed. No pertinent family history.  Social History:  Social History   Socioeconomic History   Marital status: Single    Spouse name: Not on file   Number of children: Not on file   Years of education: Not on file   Highest education level: Not on file  Occupational History   Not on file  Tobacco Use   Smoking status: Every Day    Types: Cigarettes   Smokeless tobacco: Never  Substance and Sexual Activity   Alcohol use:  No   Drug use: Yes    Types: Marijuana   Sexual activity: Yes  Other Topics Concern   Not on file  Social History Narrative   Not on file   Social Drivers of Health   Financial Resource Strain: Not on file  Food Insecurity: Low Risk  (03/25/2024)   Received from [facility]   Hunger Vital Sign    Within the past 12 months, you worried that your food would run out before you  got money to buy more: Never true    Within the past 12 months, the food you bought just didn't last and you didn't have money to get more. : Never true  Transportation Needs: No Transportation Needs (03/25/2024)   Received from [facility]    In the past 12 months, has lack of reliable transportation kept you from medical appointments, meetings, work or from getting things needed for daily living? : No  Physical Activity: Not on file  Stress: Not on file  Social Connections: Not on file    Allergies:  Allergies  Allergen Reactions   Zithromax [Azithromycin Dihydrate] Rash    Metabolic Disorder Labs: Lab Results  Component Value Date   HGBA1C 5.4 11/23/2020   MPG 108.28 11/23/2020   No results found for: PROLACTIN Lab Results  Component Value Date   CHOL 131 11/23/2020   TRIG 58 11/23/2020   HDL 34 (L) 11/23/2020   CHOLHDL 3.9 11/23/2020   VLDL 12 11/23/2020   LDLCALC 85 11/23/2020   Lab Results  Component Value Date   TSH 2.097 11/23/2020    Therapeutic Level Labs: No results found for: LITHIUM Lab Results  Component Value Date   VALPROATE 42 (L) 05/29/2018   No results found for: CBMZ  Current Medications: Current Outpatient Medications  Medication Sig Dispense Refill   hydrOXYzine  (ATARAX ) 25 MG tablet Take 1 tablet (25 mg total) by mouth 3 (three) times daily as needed. 75 tablet 1   mirtazapine  (REMERON ) 15 MG tablet Take 1 tablet (15 mg total) by mouth at bedtime. 30 tablet 1   QUEtiapine  (SEROQUEL ) 50 MG tablet Take 1 tablet (50 mg total) by mouth at bedtime for 6 days, THEN 2 tablets (100 mg total) at bedtime for 6 days, THEN 3 tablets (150 mg total) at bedtime for 18 days. 72 tablet 0   QUEtiapine  Fumarate 150 MG TABS Take 150 mg by mouth at bedtime. 30 tablet 1   No current facility-administered medications for this visit.     Musculoskeletal: Strength & Muscle Tone: within normal limits Gait & Station: normal Patient  leans: N/A  Psychiatric Specialty Exam: Review of Systems  Psychiatric/Behavioral:  Negative for decreased concentration, dysphoric mood, hallucinations, self-injury, sleep disturbance and suicidal ideas. The patient is nervous/anxious. The patient is not hyperactive.     Blood pressure 125/82, pulse (!) 56, height 6' (1.829 m), weight 195 lb 3.2 oz (88.5 kg), SpO2 100%.Body mass index is 26.47 kg/m.  General Appearance: Casual  Eye Contact:  Good  Speech:  Clear and Coherent and Normal Rate  Volume:  Normal  Mood:  Euthymic  Affect:  Appropriate  Thought Process:  Coherent, Goal Directed, and Descriptions of Associations: Intact  Orientation:  Full (Time, Place, and Person)  Thought Content: WDL   Suicidal Thoughts:  No  Homicidal Thoughts:  No  Memory:  Immediate;   Good Recent;   Good Remote;   Fair  Judgement:  Fair  Insight:  Present  Psychomotor Activity:  Normal  Concentration:  Concentration: Good and Attention Span: Good  Recall:  Good  Fund of Knowledge: Good  Language: Good  Akathisia:  No  Handed:  Right  AIMS (if indicated): not done  Assets:  Communication Skills Desire for Improvement Physical Health Transportation  ADL's:  Intact  Cognition: WNL  Sleep:  Good   Screenings: AIMS    Flowsheet Row [facility]  AIMS Total Score 0   AUDIT    Flowsheet Row [facility]  Alcohol Use Disorder Identification Test Final Score (AUDIT) 0   GAD-7    Flowsheet Row Clinical Support from 05/19/2024 in [facility] Clinical Support from 03/25/2024 in [facility] Clinical Support from 02/05/2024 in [facility] Clinical Support from 12/25/2023 in [facility] Clinical Support from 11/13/2023 in [facility]  Total GAD-7 Score 8 17 12 17 18    PHQ2-9    Flowsheet Row Clinical Support from 05/19/2024 in [facility] Clinical Support from 03/25/2024 in [facility] Clinical Support from 02/05/2024 in [facility] Clinical Support from 12/25/2023 in [facility] Clinical Support from 11/13/2023 in [facility]  PHQ-2 Total Score 1 5 2 3 6   PHQ-9 Total Score -- 18 11 15 22    Flowsheet Row Clinical Support from 05/19/2024 in [facility] Clinical Support from 03/25/2024 in [facility] Clinical Support from 02/05/2024 in [facility]  C-SSRS RISK CATEGORY Moderate Risk Moderate Risk Moderate Risk     Assessment and Plan:   Larry BECKER Chris Cripps. is a 28 year old male with a past psychiatric history significant for possible bipolar disorder, anxiety, and severe cannabis disorder who presents to [facility] for follow-up and medication management.  Patient presents to the encounter stating that he continues to take his medications as prescribed and denies experiencing any adverse side effects.  Patient denies overt depressive symptoms nor does he endorse anxiety.  A PHQ-9 screen was performed with the patient scoring a 1.  A GAD-7 screen was also performed with the patient scoring an 8.  Patient endorses stability on his current medication regimen and would like to continue taking his medications as prescribed.  Patient's medications to be e-prescribed to pharmacy of choice.  A [place] Suicide Severity Rating Scale was performed with the patient being considered moderate risk.  Patient denies suicidal ideations and is able to contract for safety at this time.  Safety planning was discussed with the patient prior to the conclusion of the  encounter.  - Patient was instructed to contact 911 in the event of a mental health crisis. - Patient was instructed to contact 988 Suicide and Crisis Lifeline in the event of a mental health crisis. - Patient was instructed to present to [facility] in the event of a mental health crisis.  Collaboration of Care: Collaboration of Care: Medication Management AEB provider managing patient's psychiatric medications, Psychiatrist AEB patient being followed by a mental health provider at this facility, and Referral or follow-up with counselor/therapist AEB patient being seen by licensed clinical social worker at this facility  Patient/Guardian was advised Release of Information must be obtained prior to any record  release in order to collaborate their care with an outside provider. Patient/Guardian was advised if they have not already done so to contact the registration department to sign all necessary forms in order for us  to release information regarding their care.   Consent: Patient/Guardian gives verbal consent for treatment and assignment of benefits for services provided during this visit. Patient/Guardian expressed understanding and agreed to proceed.   1. Moderate episode of recurrent major depressive disorder (HCC) Major depressive disorder vs Bipolar I disorder  - mirtazapine  (REMERON ) 15 MG tablet; Take 1 tablet (15 mg total) by mouth at bedtime.  Dispense: 30 tablet; Refill: 1  2. Anxiety state  - mirtazapine  (REMERON ) 15 MG tablet; Take 1 tablet (15 mg total) by mouth at bedtime.  Dispense: 30 tablet; Refill: 1 - hydrOXYzine  (ATARAX ) 25 MG tablet; Take 1 tablet (25 mg total) by mouth 3 (three) times daily as needed.  Dispense: 75 tablet; Refill: 1  Patient to follow up in 7 weeks Provider spent a total of 19 minutes with the patient/reviewing the patient's chart  Reginia FORBES Bolster, PA 05/19/2024, 2:30 PM

## 2024-05-22 ENCOUNTER — Encounter (HOSPITAL_COMMUNITY): Payer: Self-pay | Admitting: Physician Assistant

## 2024-06-03 ENCOUNTER — Encounter (HOSPITAL_COMMUNITY): Payer: Self-pay

## 2024-06-07 ENCOUNTER — Ambulatory Visit (INDEPENDENT_AMBULATORY_CARE_PROVIDER_SITE_OTHER): Admitting: Clinical

## 2024-06-07 DIAGNOSIS — F331 Major depressive disorder, recurrent, moderate: Secondary | ICD-10-CM | POA: Diagnosis not present

## 2024-07-03 NOTE — Progress Notes (Signed)
 Comprehensive Clinical Assessment (CCA) Note  06/07/2024 Larry [name] [id number]  Virtual Visit via Video Note  I connected with Larry [name] on 06/07/2024 at 10:00 AM EDT by a video enabled telemedicine application and verified that I am speaking with the correct person using two identifiers.  Location: Patient: home Provider: office   I discussed the limitations of evaluation and management by telemedicine and the availability of in person appointments. The patient expressed understanding and agreed to proceed.   Follow Up Instructions:    I discussed the assessment and treatment plan with the patient. The patient was provided an opportunity to ask questions and all were answered. The patient agreed with the plan and demonstrated an understanding of the instructions.   The patient was advised to call back or seek an in-person evaluation if the symptoms worsen or if the condition fails to improve as anticipated.  I provided 30 minutes of non-face-to-face time during this encounter.   Larry [name] Y Larry Jackowski, LCSW   Chief Complaint:  Chief Complaint  Patient presents with   Anxiety   Visit Diagnosis:   MDD, recurrent episode, moderate with anxious distress  Interpretive Summary: Client is a 28 year old male presenting to the [facility] health center to establish outpatient services. Client is presenting by referral of [facility] psychiatrist. Client has a diagnosis history of bipolar disorder, major depression, generalized anxiety and cannabis use. Client reported no current depressive symptoms but notes he does have mood swings with anger. Irritability being primary. Client reported at times he see's a shadow going past his face. Client denied auditory hallucinations. Client reported he has been living with his mother and step father since his release from jail from last year. Client reported he is more prone to lash out verbally or physically when he feels like no one  is listening to him or if he hears something he does not like. Client reported he has been trying to control his temper because his parents told him they will put him out. Client reported he has 6 months left with probation. Client reported his primary stressor is living independently and finding a job. Client reported he has several social workers and other community workers he is meeting with but is not sure what it is they are supposed to be doing for him. Client reported he is not interested in counseling because he can work on his behaviors. Client reported he wants connection to supportive employment services. Client presented oriented times five, appropriately dressed and cooperative. Client denied hallucinations, delusions, suicidal and homicidal ideations.  Treatment Recommendations: referral to [facility] vocational rehabilitation services. Client will continue medication management with [facility] psychiatry      CCA Biopsychosocial Intake/Chief Complaint:  client is presenting with a diagnosis history of bipolar disorder and cannabis use.  Current Symptoms/Problems: client reported mood swings, irritability, and anxiety  Patient Reported Schizophrenia/Schizoaffective Diagnosis in Past: No  Strengths: voluntarily engaging in outpatient services  Preferences: medicatio management  Abilities: discuss his symptoms  Type of Services Patient Feels are Needed: No data recorded  Initial Clinical Notes/Concerns: client reported he had a big mood yesterday he feels like he was not being heard out. client reported he tends to do what he needs to do to be heard out such as yelling, cussing and fighting. client reported his medication is helping to stabilize his anxiety and depression but unless a negative situation or trigger occurs he feels pretty stable. client reported sometimes at night he cries  out of nowhere. client reported medication helps him have consistent rest. client reported having  no issues of self harming thoughts.   Mental Health Symptoms Depression:  Irritability   Duration of Depressive symptoms: Greater than two weeks   Mania:  None   Anxiety:   Tension; Irritability   Psychosis:  None   Duration of Psychotic symptoms: No data recorded  Trauma:  None   Obsessions:  None   Compulsions:  None   Inattention:  None   Hyperactivity/Impulsivity:  None   Oppositional/Defiant Behaviors:  None   Emotional Irregularity:  None   Other Mood/Personality Symptoms:  No data recorded   Mental Status Exam Appearance and self-care  Stature:  Average   Weight:  Average weight   Clothing:  Casual   Grooming:  Normal   Cosmetic use:  Age appropriate   Posture/gait:  Normal   Motor activity:  Not Remarkable   Sensorium  Attention:  Normal   Concentration:  Normal   Orientation:  X5   Recall/memory:  Normal   Affect and Mood  Affect:  Congruent   Mood:  Angry   Relating  Eye contact:  Normal   Facial expression:  Responsive   Attitude toward examiner:  Cooperative; Critical   Thought and Language  Speech flow: Clear and Coherent   Thought content:  Appropriate to Mood and Circumstances   Preoccupation:  None   Hallucinations:  None   Organization:  No data recorded  [employer] of Knowledge:  Good   Intelligence:  Average   Abstraction:  Normal   Judgement:  Fair   Dance Movement Psychotherapist:  Adequate   Insight:  Fair   Decision Making:  Normal   Social Functioning  Social Maturity:  Self-centered   Social Judgement:  Chief Of Staff   Stress  Stressors:  Housing; Armed Forces Operational Officer; Family conflict   Coping Ability:  Resilient   Skill Deficits:  Activities of daily living   Supports:  Family (mom, main support)     Religion: Religion/Spirituality Are You A Religious Person?: No  Leisure/Recreation: Leisure / Recreation Do You Have Hobbies?: No  Exercise/Diet: Exercise/Diet Do You Exercise?: No Have  You Gained or Lost A Significant Amount of Weight in the Past Six Months?: No Do You Follow a Special Diet?: No Do You Have Any Trouble Sleeping?: No   CCA Employment/Education Employment/Work Situation: Employment / Work Situation Employment Situation: Unemployed (working on getting disability back.)  Education: Education Did Garment/textile Technologist From [employer]?: No   CCA Family/Childhood History Family and Relationship History: Family history Marital status: Single Does patient have children?: No  Childhood History:  Childhood History By whom was/is the patient raised?: Mother Additional childhood history information: client reported he is form [place]  ,born and raised. Does patient have siblings?: No Did patient suffer any verbal/emotional/physical/sexual abuse as a child?: No Did patient suffer from severe childhood neglect?: No Has patient ever been sexually abused/assaulted/raped as an adolescent or adult?: No Was the patient ever a victim of a crime or a disaster?: No Witnessed domestic violence?: No Has patient been affected by domestic violence as an adult?: No  Child/Adolescent Assessment:     CCA Substance Use Alcohol/Drug Use: Alcohol / Drug Use History of alcohol / drug use?: No history of alcohol / drug abuse                         ASAM's:  Six Dimensions of Multidimensional Assessment  Dimension 1:  Acute Intoxication and/or Withdrawal Potential:      Dimension 2:  Biomedical Conditions and Complications:      Dimension 3:  Emotional, Behavioral, or Cognitive Conditions and Complications:     Dimension 4:  Readiness to Change:     Dimension 5:  Relapse, Continued use, or Continued Problem Potential:     Dimension 6:  Recovery/Living Environment:     ASAM Severity Score:    ASAM Recommended Level of Treatment:     Substance use Disorder (SUD)    Recommendations for Services/Supports/Treatments: Recommendations for  Services/Supports/Treatments Recommendations For Services/Supports/Treatments: Medication Management  DSM5 Diagnoses: Patient Active Problem List   Diagnosis Date Noted   Anxiety state 10/01/2023   Bipolar I disorder, most recent episode depressed (HCC)    Antisocial personality disorder (HCC) 05/26/2018   Severe cannabis use disorder (HCC) 05/26/2018   Bipolar I disorder, most recent episode (or current) manic (HCC) 05/25/2018   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 Patient/Guardian was advised if they have not already done so to contact the registration department to sign all necessary forms in order for us  to release information regarding their care.   Consent: Patient/Guardian gives verbal consent for treatment and assignment of benefits for services provided during this visit. Patient/Guardian expressed understanding and agreed to proceed.   Henry Utsey Y Marisela Line, LCSW

## 2024-07-06 ENCOUNTER — Ambulatory Visit (HOSPITAL_COMMUNITY): Admitting: Physician Assistant

## 2024-07-06 ENCOUNTER — Encounter (HOSPITAL_COMMUNITY): Payer: Self-pay | Admitting: Physician Assistant

## 2024-07-06 DIAGNOSIS — F331 Major depressive disorder, recurrent, moderate: Secondary | ICD-10-CM | POA: Diagnosis not present

## 2024-07-06 DIAGNOSIS — F411 Generalized anxiety disorder: Secondary | ICD-10-CM | POA: Diagnosis not present

## 2024-07-06 MED ORDER — MIRTAZAPINE 15 MG PO TABS: 15.0000 mg | ORAL_TABLET | Freq: Every day | ORAL | 1 refills | Status: AC

## 2024-07-06 MED ORDER — MIRTAZAPINE 7.5 MG PO TABS: ORAL_TABLET | ORAL | 0 refills | Status: AC

## 2024-07-06 MED ORDER — HYDROXYZINE HCL 25 MG PO TABS: 25.0000 mg | ORAL_TABLET | Freq: Three times a day (TID) | ORAL | 1 refills | Status: AC | PRN

## 2024-07-06 NOTE — Progress Notes (Signed)
 BH MD/PA/NP OP Progress Note  07/06/2024 2:00 PM Larry [name]  MRN:  [id number]  Chief Complaint:  Chief Complaint  Patient presents with   Follow-up   Medication Refill   HPI:   Larry [name] Larry Eudy. is a 28 year old male with a past psychiatric history significant for possible bipolar disorder, anxiety, and severe cannabis disorder who presents to [facility] for follow-up and medication management.  During his last encounter, patient was being managed on the following psychiatric medications:  Mirtazapine  15 mg at bedtime Hydroxyzine  10 mg 3 times daily as needed  Patient presents to the encounter irritable.  He reports that he was denied a voucher for his own apartment and is really frustrated over the situation.  He reports that people keep lying to him regarding resources available to him.  He also reports that he is fearful of violating his probation due to his past history of marijuana use.  Due to his stressors, patient reports that going back to jail would be a stress relief for him.  Patient reports that he believes that he is on probation to see if he catches another charge.  Due to being denied resources, patient reports that he has not been taking his medications since everyone implies that there is nothing wrong with him.  Patient expresses annoyance that even though he has a mental illness, he does not qualify for various resources.  A PHQ-9 screen was performed with the patient scoring a 17.  A GAD-7 screen was also performed with the patient scoring a 12.  Patient is alert and oriented x 4, calm, cooperative, and fully engaged in conversation during the encounter.  Patient endorses irritability.  Patient exhibits irritable mood with congruent affect.  Patient denies suicidal or homicidal ideations.  He further denies auditory or visual hallucinations and does not appear to be responding to internal/external stimuli.  Patient endorses good  sleep and receives on average 10 hours of sleep per night.  Patient endorses fair appetite and eats on average 2 meals per day.  Patient denies alcohol consumption.  Patient endorses tobacco use and smokes on average 3 cigarettes/day.  Patient denies illicit drug use.  Visit Diagnosis:    ICD-10-CM   1. Moderate episode of recurrent major depressive disorder (HCC)  F33.1 mirtazapine  (REMERON ) 7.5 MG tablet    mirtazapine  (REMERON ) 15 MG tablet    2. Anxiety state  F41.1 hydrOXYzine  (ATARAX ) 25 MG tablet    mirtazapine  (REMERON ) 7.5 MG tablet    mirtazapine  (REMERON ) 15 MG tablet      Past Psychiatric History:  Patient endorses a past psychiatric history significant for schizophrenia and bipolar disorder   Patient endorses a past history of hospitalization due to mental health.  He reports that he was last hospitalized at [facility] when diagnosed with schizophrenia.   Patient endorses a past history of suicide attempt stating that he once tried to jump out of a moving car   Patient denies a past history of homicide attempt  Past Medical History:  Past Medical History:  Diagnosis Date   Bipolar 1 disorder (HCC)    History reviewed. No pertinent surgical history.  Family Psychiatric History:  Patient is unsure of family history of psychiatric illness   Family history of suicide attempt: Patient denies Family history of homicide attempt: Patient denies Family history of substance abuse: Patient reports some of his family members smoked marijuana  Family History: History reviewed. No pertinent family  history.  Social History:  Social History   Socioeconomic History   Marital status: Single    Spouse name: Not on file   Number of children: Not on file   Years of education: Not on file   Highest education level: Not on file  Occupational History   Not on file  Tobacco Use   Smoking status: Every Day    Types: Cigarettes   Smokeless tobacco: Never  Substance and  Sexual Activity   Alcohol use: No   Drug use: Yes    Types: Marijuana   Sexual activity: Yes  Other Topics Concern   Not on file  Social History Narrative   Not on file   Social Drivers of Health   Financial Resource Strain: Not on file  Food Insecurity: Low Risk  (03/25/2024)   Received from [facility]   Hunger Vital Sign    Within the past 12 months, you worried that your food would run out before you got money to buy more: Never true    Within the past 12 months, the food you bought just didn't last and you didn't have money to get more. : Never true  Transportation Needs: No Transportation Needs (03/25/2024)   Received from [facility]    In the past 12 months, has lack of reliable transportation kept you from medical appointments, meetings, work or from getting things needed for daily living? : No  Physical Activity: Not on file  Stress: Not on file  Social Connections: Not on file    Allergies:  Allergies  Allergen Reactions   Zithromax [Azithromycin Dihydrate] Rash    Metabolic Disorder Labs: Lab Results  Component Value Date   HGBA1C 5.4 11/23/2020   MPG 108.28 11/23/2020   No results found for: PROLACTIN Lab Results  Component Value Date   CHOL 131 11/23/2020   TRIG 58 11/23/2020   HDL 34 (L) 11/23/2020   CHOLHDL 3.9 11/23/2020   VLDL 12 11/23/2020   LDLCALC 85 11/23/2020   Lab Results  Component Value Date   TSH 2.097 11/23/2020    Therapeutic Level Labs: No results found for: LITHIUM Lab Results  Component Value Date   VALPROATE 42 (L) 05/29/2018   No results found for: CBMZ  Current Medications: Current Outpatient Medications  Medication Sig Dispense Refill   mirtazapine  (REMERON ) 7.5 MG tablet Patient to take mirtazapine  7.5 mg at bedtime for 6 days, then continue taking 15 mg at bedtime. 6 tablet 0   hydrOXYzine  (ATARAX ) 25 MG tablet Take 1 tablet (25 mg total) by mouth 3 (three) times daily as needed. 75 tablet  1   mirtazapine  (REMERON ) 15 MG tablet Take 1 tablet (15 mg total) by mouth at bedtime. 30 tablet 1   QUEtiapine  (SEROQUEL ) 50 MG tablet Take 1 tablet (50 mg total) by mouth at bedtime for 6 days, THEN 2 tablets (100 mg total) at bedtime for 6 days, THEN 3 tablets (150 mg total) at bedtime for 18 days. 72 tablet 0   QUEtiapine  Fumarate 150 MG TABS Take 150 mg by mouth at bedtime. 30 tablet 1   No current facility-administered medications for this visit.     Musculoskeletal: Strength & Muscle Tone: within normal limits Gait & Station: normal Patient leans: N/A  Psychiatric Specialty Exam: Review of Systems  Psychiatric/Behavioral:  Positive for agitation. Negative for decreased concentration, dysphoric mood, hallucinations, self-injury, sleep disturbance and suicidal ideas. The patient is nervous/anxious. The patient is not hyperactive.  Blood pressure 133/80, pulse (!) 105, height 6' (1.829 m), weight 190 lb (86.2 kg), SpO2 99%.Body mass index is 25.77 kg/m.  General Appearance: Casual  Eye Contact:  Good  Speech:  Clear and Coherent and Normal Rate  Volume:  Normal  Mood:  Anxious and Irritable  Affect:  Congruent  Thought Process:  Coherent, Goal Directed, and Descriptions of Associations: Intact  Orientation:  Full (Time, Place, and Person)  Thought Content: WDL   Suicidal Thoughts:  No  Homicidal Thoughts:  No  Memory:  Immediate;   Good Recent;   Good Remote;   Fair  Judgement:  Fair  Insight:  Present  Psychomotor Activity:  Normal  Concentration:  Concentration: Good and Attention Span: Good  Recall:  Good  Fund of Knowledge: Good  Language: Good  Akathisia:  No  Handed:  Right  AIMS (if indicated): not done  Assets:  Communication Skills Desire for Improvement Physical Health Transportation  ADL's:  Intact  Cognition: WNL  Sleep:  Fair   Screenings: AIMS    Flowsheet Row [facility]  AIMS Total Score 0   AUDIT    Flowsheet Row [facility]  Alcohol Use Disorder Identification Test Final Score (AUDIT) 0   GAD-7    Flowsheet Row Clinical Support from 07/06/2024 in [facility] Clinical Support from 05/19/2024 in [facility] Clinical Support from 03/25/2024 in [facility] Clinical Support from 02/05/2024 in [facility] Clinical Support from 12/25/2023 in [facility]  Total GAD-7 Score 12 8 17 12 17    PHQ2-9    Flowsheet Row Clinical Support from 07/06/2024 in [facility] Clinical Support from 05/19/2024 in [facility] Clinical Support from 03/25/2024 in [facility] Clinical Support from 02/05/2024 in [facility] Clinical Support from 12/25/2023 in [facility]  PHQ-2 Total Score 5 1 5 2 3   PHQ-9 Total Score 17 -- 18 11 15    Flowsheet Row Clinical Support from 07/06/2024 in [facility] Clinical Support from 05/19/2024 in [facility] Clinical Support from 03/25/2024 in [facility]  C-SSRS RISK CATEGORY Moderate Risk Moderate Risk Moderate Risk     Assessment and Plan:   Larry BECKER Kaven Cumbie. is a 28 year old male with a past psychiatric history significant for possible bipolar disorder, anxiety, and severe cannabis disorder who presents to [facility] for follow-up and medication management.  Patient presents to the encounter stating that he has not been taking his medications regularly.  Patient reports that he gave up on taking his medications after being denied a voucher for his own apartment.  Patient  also reports that he has been denied other resources such as food stamps.  Patient informed provider that he quit taking his medications since everybody implies that there is nothing wrong with him and because he is unable to qualify for resources available to people with illnesses.  Provider encouraged patient to continue taking his medications as prescribed.  Since it is undetermined how long patient has been without his mirtazapine , provider to place patient on mirtazapine  7.5 mg for 6 days, followed by 50 mg at bedtime for the management of his depressive symptoms and anxiety.  Patient  to continue taking his hydroxyzine  as prescribed.  Provider to reach out to patient's mother regarding patient's frustrations over being disqualified for a voucher for an apartment.  Provider to reach out to DSS to determine if patient is eligible for food stamps.  A [place] Suicide Severity Rating Scale was performed with the patient being considered moderate risk.  Patient denies suicidal ideations and is able to contract for safety at this time.  Safety planning was discussed with the patient prior to the conclusion of the encounter.  - Patient was instructed to contact 911 in the event of a mental health crisis. - Patient was instructed to contact [employer] in the event of a mental health crisis. - Patient was instructed to present to [facility] in the event of a mental health crisis.  Collaboration of Care: Collaboration of Care: Medication Management AEB provider managing patient's psychiatric medications, Psychiatrist AEB patient being followed by a mental health provider at this facility, and Referral or follow-up with counselor/therapist AEB patient being seen by licensed clinical social worker at this facility  Patient/Guardian was advised Release of Information must be obtained prior to any record release in order to collaborate their care with an  outside provider. Patient/Guardian was advised if they have not already done so to contact the registration department to sign all necessary forms in order for us  to release information regarding their care.   Consent: Patient/Guardian gives verbal consent for treatment and assignment of benefits for services provided during this visit. Patient/Guardian expressed understanding and agreed to proceed.   1. Moderate episode of recurrent major depressive disorder (HCC) Major depressive disorder vs Bipolar I disorder  - mirtazapine  (REMERON ) 7.5 MG tablet; Patient to take mirtazapine  7.5 mg at bedtime for 6 days, then continue taking 15 mg at bedtime.  Dispense: 6 tablet; Refill: 0 - mirtazapine  (REMERON ) 15 MG tablet; Take 1 tablet (15 mg total) by mouth at bedtime.  Dispense: 30 tablet; Refill: 1  2. Anxiety state  - hydrOXYzine  (ATARAX ) 25 MG tablet; Take 1 tablet (25 mg total) by mouth 3 (three) times daily as needed.  Dispense: 75 tablet; Refill: 1 - mirtazapine  (REMERON ) 7.5 MG tablet; Patient to take mirtazapine  7.5 mg at bedtime for 6 days, then continue taking 15 mg at bedtime.  Dispense: 6 tablet; Refill: 0 - mirtazapine  (REMERON ) 15 MG tablet; Take 1 tablet (15 mg total) by mouth at bedtime.  Dispense: 30 tablet; Refill: 1  Patient to follow up in 6 weeks Provider spent a total of 18 minutes with the patient/reviewing the patient's chart  Larry FORBES Bolster, PA 07/06/2024, 2:00 PM

## 2024-08-03 ENCOUNTER — Telehealth (HOSPITAL_COMMUNITY): Payer: Self-pay

## 2024-08-03 NOTE — Telephone Encounter (Signed)
 Larry [name] with [facility] with [employer] emailed this therapist and inquired for behavioral updates on this pt.  This therapist sent the following secure email:   Larry [name] DOB [birth year] presented for a CCA on 06-07-24, conducted by Paige Cozart, LASW and she referred him for medication management and he saw [name], PA on 07-06-24. He has a follow up with [name] on 08-28-24.  [name] also referred him to [facility]. If you have further questions, please let me know.  Darice Simpler, MS, LMFT, LCAS

## 2024-08-15 ENCOUNTER — Other Ambulatory Visit (HOSPITAL_COMMUNITY): Payer: Self-pay | Admitting: Physician Assistant

## 2024-08-15 DIAGNOSIS — F331 Major depressive disorder, recurrent, moderate: Secondary | ICD-10-CM

## 2024-08-15 DIAGNOSIS — F411 Generalized anxiety disorder: Secondary | ICD-10-CM

## 2024-08-18 ENCOUNTER — Ambulatory Visit (INDEPENDENT_AMBULATORY_CARE_PROVIDER_SITE_OTHER): Payer: MEDICAID | Admitting: Physician Assistant

## 2024-08-18 ENCOUNTER — Encounter (HOSPITAL_COMMUNITY): Payer: Self-pay | Admitting: Physician Assistant

## 2024-08-18 VITALS — BP 134/84 | HR 70 | Ht 72.0 in | Wt 201.6 lb

## 2024-08-18 DIAGNOSIS — F411 Generalized anxiety disorder: Secondary | ICD-10-CM | POA: Diagnosis not present

## 2024-08-18 DIAGNOSIS — F331 Major depressive disorder, recurrent, moderate: Secondary | ICD-10-CM

## 2024-08-18 DIAGNOSIS — F1220 Cannabis dependence, uncomplicated: Secondary | ICD-10-CM

## 2024-08-18 NOTE — Progress Notes (Signed)
 BH MD/PA/NP OP Progress Note  08/18/2024 2:00 PM Larry [name]  MRN:  [id number]  Chief Complaint:  Chief Complaint  Patient presents with   Follow-up   HPI:   Larry [name] Larry [name] is a 28 year old male with a past psychiatric history significant for possible bipolar disorder, anxiety, and severe cannabis disorder who presents to [facility] for follow-up and medication management.  During his last encounter, patient was being managed on the following psychiatric medications:  Mirtazapine  15 mg at bedtime Hydroxyzine  10 mg 3 times daily as needed  Patient presents to the encounter extremely irritable and agitated.  He informed provider that he will be going to jail this Friday due to failing so many urine drug screens.  Patient also laments about how he was denied housing even though he has no form of income coming in.  Patient expresses disappointment stating that people around him are in the same situation as but are getting benefits.  He reports that he feels like no one is trying to help him out.  He reports that he stopped taking his medications since he feels like everyone thinks that there is nothing wrong with him.  He reports that he has not been happy for a long time due to his current circumstance.  A PHQ-9 screen was performed with the patient scoring a 12.  A GAD-7 screen was also performed the patient scoring a 16.  Patient is alert and oriented x 4, calm, cooperative, and fully engaged in conversation during the encounter.  Though patient is agitated, patient reports that his mood is okay.  Patient exhibits irritable mood with congruent affect.  Patient denies suicidal or homicidal ideations.  He further denies auditory or visual hallucinations and does not appear to be responding to internal/external stimuli.  Patient endorses increased sleep and receives on average 12 hours of sleep per night.  Patient endorses good appetite and eats on  average 3 meals per day.  Patient denies alcohol consumption.  Patient endorses tobacco use and smokes on average 3 cigarettes/day.  Patient endorses illicit drug use in the form of marijuana.  Visit Diagnosis:    ICD-10-CM   1. Moderate episode of recurrent major depressive disorder (HCC)  F33.1     2. Anxiety state  F41.1     3. Severe cannabis use disorder (HCC)  F12.20        Past Psychiatric History:  Patient endorses a past psychiatric history significant for schizophrenia and bipolar disorder   Patient endorses a past history of hospitalization due to mental health.  He reports that he was last hospitalized at [facility] when diagnosed with schizophrenia.   Patient endorses a past history of suicide attempt stating that he once tried to jump out of a moving car   Patient denies a past history of homicide attempt  Past Medical History:  Past Medical History:  Diagnosis Date   Bipolar 1 disorder (HCC)    History reviewed. No pertinent surgical history.  Family Psychiatric History:  Patient is unsure of family history of psychiatric illness   Family history of suicide attempt: Patient denies Family history of homicide attempt: Patient denies Family history of substance abuse: Patient reports some of his family members smoked marijuana  Family History: History reviewed. No pertinent family history.  Social History:  Social History   Socioeconomic History   Marital status: Single    Spouse name: Not on file   Number of children:  Not on file   Years of education: Not on file   Highest education level: Not on file  Occupational History   Not on file  Tobacco Use   Smoking status: Every Day    Types: Cigarettes   Smokeless tobacco: Never  Substance and Sexual Activity   Alcohol use: No   Drug use: Yes    Types: Marijuana   Sexual activity: Yes  Other Topics Concern   Not on file  Social History Narrative   Not on file   Social Drivers of Health    Tobacco Use: High Risk (08/18/2024)   Patient History    Smoking Tobacco Use: Every Day    Smokeless Tobacco Use: Never    Passive Exposure: Not on file  Financial Resource Strain: Not on file  Food Insecurity: Low Risk (03/25/2024)   Received from [facility]   Epic    Within the past 12 months, you worried that your food would run out before you got money to buy more: Never true    Within the past 12 months, the food you bought just didn't last and you didn't have money to get more. : Never true  Transportation Needs: No Transportation Needs (03/25/2024)   Received from [facility]    In the past 12 months, has lack of reliable transportation kept you from medical appointments, meetings, work or from getting things needed for daily living? : No  Physical Activity: Not on file  Stress: Not on file  Social Connections: Not on file  Depression (PHQ2-9): High Risk (08/18/2024)   Depression (PHQ2-9)    PHQ-2 Score: 12  Alcohol Screen: Not on file  Housing: Low Risk (03/25/2024)   Received from [facility]   Epic    What is your living situation today?: I have a steady place to live    Think about the place you live. Do you have problems with any of the following? Choose all that apply:: None/None on this list  Utilities: Low Risk (03/25/2024)   Received from [facility]   Utilities    In the past 12 months has the electric, gas, oil, or water  company threatened to shut off services in your home? : No  Health Literacy: Not on file    Allergies:  Allergies  Allergen Reactions   Zithromax [Azithromycin Dihydrate] Rash    Metabolic Disorder Labs: Lab Results  Component Value Date   HGBA1C 5.4 11/23/2020   MPG 108.28 11/23/2020   No results found for: PROLACTIN Lab Results  Component Value Date   CHOL 131 11/23/2020   TRIG 58 11/23/2020   HDL 34 (L) 11/23/2020   CHOLHDL 3.9 11/23/2020   VLDL 12 11/23/2020   LDLCALC 85 11/23/2020   Lab Results   Component Value Date   TSH 2.097 11/23/2020    Therapeutic Level Labs: No results found for: LITHIUM Lab Results  Component Value Date   VALPROATE 42 (L) 05/29/2018   No results found for: CBMZ  Current Medications: Current Outpatient Medications  Medication Sig Dispense Refill   hydrOXYzine  (ATARAX ) 25 MG tablet Take 1 tablet (25 mg total) by mouth 3 (three) times daily as needed. 75 tablet 1   mirtazapine  (REMERON ) 15 MG tablet Take 1 tablet (15 mg total) by mouth at bedtime. 30 tablet 1   mirtazapine  (REMERON ) 7.5 MG tablet Patient to take mirtazapine  7.5 mg at bedtime for 6 days, then continue taking 15 mg at bedtime. 6 tablet 0  QUEtiapine  (SEROQUEL ) 50 MG tablet Take 1 tablet (50 mg total) by mouth at bedtime for 6 days, THEN 2 tablets (100 mg total) at bedtime for 6 days, THEN 3 tablets (150 mg total) at bedtime for 18 days. 72 tablet 0   QUEtiapine  Fumarate 150 MG TABS Take 150 mg by mouth at bedtime. 30 tablet 1   No current facility-administered medications for this visit.     Musculoskeletal: Strength & Muscle Tone: within normal limits Gait & Station: normal Patient leans: N/A  Psychiatric Specialty Exam: Review of Systems  Psychiatric/Behavioral:  Positive for agitation. Negative for decreased concentration, dysphoric mood, hallucinations, self-injury, sleep disturbance and suicidal ideas. The patient is nervous/anxious. The patient is not hyperactive.     Blood pressure 134/84, pulse 70, height 6' (1.829 m), weight 201 lb 9.6 oz (91.4 kg), SpO2 98%.Body mass index is 27.34 kg/m.  General Appearance: Casual  Eye Contact:  Good  Speech:  Clear and Coherent and Normal Rate  Volume:  Normal  Mood:  Anxious and Irritable  Affect:  Congruent  Thought Process:  Coherent, Goal Directed, and Descriptions of Associations: Intact  Orientation:  Full (Time, Place, and Person)  Thought Content: WDL   Suicidal Thoughts:  No  Homicidal Thoughts:  No  Memory:   Immediate;   Good Recent;   Good Remote;   Fair  Judgement:  Fair  Insight:  Present  Psychomotor Activity:  Normal  Concentration:  Concentration: Good and Attention Span: Good  Recall:  Good  Fund of Knowledge: Good  Language: Good  Akathisia:  No  Handed:  Right  AIMS (if indicated): not done  Assets:  Communication Skills Desire for Improvement Physical Health Transportation  ADL's:  Intact  Cognition: WNL  Sleep:  Fair   Screenings: AIMS    Flowsheet Row [facility]  AIMS Total Score 0   AUDIT    Flowsheet Row [facility]  Alcohol Use Disorder Identification Test Final Score (AUDIT) 0   GAD-7    Flowsheet Row Clinical Support from 08/18/2024 in [facility] Clinical Support from 07/06/2024 in [facility] Clinical Support from 05/19/2024 in [facility] Clinical Support from 03/25/2024 in [facility] Clinical Support from 02/05/2024 in [facility]  Total GAD-7 Score 16 12 8 17 12    PHQ2-9    Flowsheet Row Clinical Support from 08/18/2024 in [facility] Clinical Support from 07/06/2024 in [facility] Clinical Support from 05/19/2024 in [facility] Clinical Support from 03/25/2024 in [facility] Clinical Support from 02/05/2024 in [facility]  PHQ-2 Total Score 1 5 1 5 2   PHQ-9 Total Score 12 17 -- 18 11   Flowsheet Row Clinical Support from 08/18/2024 in [facility] Clinical Support from 07/06/2024 in [facility] Clinical Support from 05/19/2024 in [facility]   C-SSRS RISK CATEGORY Moderate Risk Moderate Risk Moderate Risk     Assessment and Plan:   Larry BECKER Karthikeya Funke. is a 28 year old male with a past psychiatric history significant for possible bipolar disorder, anxiety, and severe cannabis disorder who presents to [facility] for follow-up and medication management.  Patient presents to the encounter stating that he  has not been taking his medications regularly.   Patient presents to the encounter stating that he has not been taking his medications stating that since everyone wants to treat him like nothing is wrong with him then he will stop taking his medications.  This all stems from the fact that patient was recently denied housing even though he has no form of income.  Patient states that he was promised that he would be able to get some form of housing due to his current situation and states that he did everything that he was told to do that would guarantee him having, but was still denied housing.  Patient also informed provider that he will be going to jail due to failing a urine drug test.  Patient was angry during the majority of the encounter talking about how the system is rigged against him.  He laments that there are several people in his situation that are able to get benefits, but somehow he is unable to.  Patient refuses to take his medications stating that since he is not receiving benefits, then that must mean people do not believe that he has a mental illness.  Though patient declines medication management at this time, he is agreeable to following up during his next encounter if he is out of jail by then.  A [place] Suicide Severity Rating Scale was performed with the patient being considered moderate risk.  Patient denies suicidal ideations and is able to contract for safety at this time.  Safety planning was discussed with the patient prior to the conclusion of the encounter.  - Patient was  instructed to contact 911 in the event of a mental health crisis. - Patient was instructed to contact 988 Suicide and Crisis Lifeline in the event of a mental health crisis. - Patient was instructed to present to [facility] in the event of a mental health crisis.  Collaboration of Care: Collaboration of Care: Medication Management AEB provider managing patient's psychiatric medications, Psychiatrist AEB patient being followed by a mental health provider at this facility, and Referral or follow-up with counselor/therapist AEB patient being seen by licensed clinical social worker at this facility  Patient/Guardian was advised Release of Information must be obtained prior to any record release in order to collaborate their care with an outside provider. Patient/Guardian was advised if they have not already done so to contact the registration department to sign all necessary forms in order for us  to release information regarding their care.   Consent: Patient/Guardian gives verbal consent for treatment and assignment of benefits for services provided during this visit. Patient/Guardian expressed understanding and agreed to proceed.   1. Moderate episode of recurrent major depressive disorder (HCC) (Primary) Major depressive disorder vs Bipolar I disorder Patient declines medication management at this time  2. Anxiety state Patient declines medication management at this time  3. Severe cannabis use disorder [facility])  Patient to follow up in 6 weeks Provider spent a total of 28 minutes with the patient/reviewing the patient's chart  Reginia FORBES Bolster, PA 08/18/2024, 2:00 PM

## 2024-08-23 ENCOUNTER — Telehealth (HOSPITAL_COMMUNITY): Payer: Self-pay

## 2024-08-23 NOTE — Telephone Encounter (Signed)
 Wilhemenia Lunger from [facility] [employer] securely emailed me regarding this pt and requesting an update since the last update was given on 07-27-24.  Therapist send this message via secure email: Joshoa Shawler just saw his medication provider on 08-18-24 and has a follow up appt on  09-29-2024 with the same provider.  Darice Simpler, MS, LMFT, LCAS

## 2024-09-29 ENCOUNTER — Encounter (HOSPITAL_COMMUNITY): Payer: Self-pay | Admitting: Physician Assistant

## 2024-09-29 ENCOUNTER — Ambulatory Visit (INDEPENDENT_AMBULATORY_CARE_PROVIDER_SITE_OTHER): Payer: MEDICAID | Admitting: Physician Assistant

## 2024-09-29 VITALS — BP 131/85 | HR 94 | Temp 98.4°F | Ht 72.0 in | Wt 208.0 lb

## 2024-09-29 DIAGNOSIS — F331 Major depressive disorder, recurrent, moderate: Secondary | ICD-10-CM | POA: Diagnosis not present

## 2024-09-29 DIAGNOSIS — F1220 Cannabis dependence, uncomplicated: Secondary | ICD-10-CM

## 2024-09-29 DIAGNOSIS — F411 Generalized anxiety disorder: Secondary | ICD-10-CM | POA: Diagnosis not present

## 2024-09-29 NOTE — Progress Notes (Unsigned)
 BH MD/PA/NP OP Progress Note  09/29/2024 2:00 PM Larry [name]  MRN:  [id number]  Chief Complaint:  Chief Complaint  Patient presents with   Follow-up   HPI:   Larry [name] Larry [name] is a 29 year old male with a past psychiatric history significant for possible bipolar disorder, anxiety, and severe cannabis disorder who presents to [facility] for follow-up and medication management. ***  ***  Patient is alert and oriented x 4, calm, cooperative, and fully engaged in conversation during the encounter. ***  Visit Diagnosis:    ICD-10-CM   1. Moderate episode of recurrent major depressive disorder (HCC)  F33.1     2. Anxiety state  F41.1     3. Severe cannabis use disorder (HCC)  F12.20       Past Psychiatric History:  Patient endorses a past psychiatric history significant for schizophrenia and bipolar disorder   Patient endorses a past history of hospitalization due to mental health.  He reports that he was last hospitalized at [facility] when diagnosed with schizophrenia.   Patient endorses a past history of suicide attempt stating that he once tried to jump out of a moving car   Patient denies a past history of homicide attempt  Past Medical History:  Past Medical History:  Diagnosis Date   Bipolar 1 disorder (HCC)    History reviewed. No pertinent surgical history.  Family Psychiatric History:  Patient is unsure of family history of psychiatric illness   Family history of suicide attempt: Patient denies Family history of homicide attempt: Patient denies Family history of substance abuse: Patient reports some of his family members smoked marijuana  Family History: History reviewed. No pertinent family history.  Social History:  Social History   Socioeconomic History   Marital status: Single    Spouse name: Not on file   Number of children: Not on file   Years of education: Not on file   Highest education  level: Not on file  Occupational History   Not on file  Tobacco Use   Smoking status: Every Day    Types: Cigarettes   Smokeless tobacco: Never  Substance and Sexual Activity   Alcohol use: No   Drug use: Yes    Types: Marijuana   Sexual activity: Yes  Other Topics Concern   Not on file  Social History Narrative   Not on file   Social Drivers of Health   Tobacco Use: High Risk (09/29/2024)   Patient History    Smoking Tobacco Use: Every Day    Smokeless Tobacco Use: Never    Passive Exposure: Not on file  Financial Resource Strain: Not on file  Food Insecurity: Low Risk (03/25/2024)   Received from [facility]   Epic    Within the past 12 months, you worried that your food would run out before you got money to buy more: Never true    Within the past 12 months, the food you bought just didn't last and you didn't have money to get more. : Never true  Transportation Needs: No Transportation Needs (03/25/2024)   Received from [employer]    In the past 12 months, has lack of reliable transportation kept you from medical appointments, meetings, work or from getting things needed for daily living? : No  Physical Activity: Not on file  Stress: Not on file  Social Connections: Not on file  Depression (PHQ2-9): High Risk (09/29/2024)   Depression (  PHQ2-9)    PHQ-2 Score: 12  Alcohol Screen: Not on file  Housing: Low Risk (03/25/2024)   Received from [facility]   Epic    What is your living situation today?: I have a steady place to live    Think about the place you live. Do you have problems with any of the following? Choose all that apply:: None/None on this list  Utilities: Low Risk (03/25/2024)   Received from [facility]   Utilities    In the past 12 months has the electric, gas, oil, or water  company threatened to shut off services in your home? : No  Health Literacy: Not on file    Allergies:  Allergies  Allergen Reactions   Zithromax  [Azithromycin Dihydrate] Rash    Metabolic Disorder Labs: Lab Results  Component Value Date   HGBA1C 5.4 11/23/2020   MPG 108.28 11/23/2020   No results found for: PROLACTIN Lab Results  Component Value Date   CHOL 131 11/23/2020   TRIG 58 11/23/2020   HDL 34 (L) 11/23/2020   CHOLHDL 3.9 11/23/2020   VLDL 12 11/23/2020   LDLCALC 85 11/23/2020   Lab Results  Component Value Date   TSH 2.097 11/23/2020    Therapeutic Level Labs: No results found for: LITHIUM Lab Results  Component Value Date   VALPROATE 42 (L) 05/29/2018   No results found for: CBMZ  Current Medications: Current Outpatient Medications  Medication Sig Dispense Refill   hydrOXYzine  (ATARAX ) 25 MG tablet Take 1 tablet (25 mg total) by mouth 3 (three) times daily as needed. 75 tablet 1   mirtazapine  (REMERON ) 15 MG tablet Take 1 tablet (15 mg total) by mouth at bedtime. 30 tablet 1   mirtazapine  (REMERON ) 7.5 MG tablet Patient to take mirtazapine  7.5 mg at bedtime for 6 days, then continue taking 15 mg at bedtime. 6 tablet 0   QUEtiapine  (SEROQUEL ) 50 MG tablet Take 1 tablet (50 mg total) by mouth at bedtime for 6 days, THEN 2 tablets (100 mg total) at bedtime for 6 days, THEN 3 tablets (150 mg total) at bedtime for 18 days. 72 tablet 0   QUEtiapine  Fumarate 150 MG TABS Take 150 mg by mouth at bedtime. 30 tablet 1   No current facility-administered medications for this visit.     Musculoskeletal: Strength & Muscle Tone: within normal limits Gait & Station: normal Patient leans: N/A  Psychiatric Specialty Exam: Review of Systems  Psychiatric/Behavioral:  Positive for agitation. Negative for decreased concentration, dysphoric mood, hallucinations, self-injury, sleep disturbance and suicidal ideas. The patient is nervous/anxious. The patient is not hyperactive.     Blood pressure 131/85, pulse 94, temperature 98.4 F (36.9 C), temperature source Oral, height 6' (1.829 m), weight 208 lb (94.3 kg),  SpO2 96%.Body mass index is 28.21 kg/m.  General Appearance: Casual  Eye Contact:  Good  Speech:  Clear and Coherent and Normal Rate  Volume:  Normal  Mood:  Anxious and Irritable  Affect:  Congruent  Thought Process:  Coherent, Goal Directed, and Descriptions of Associations: Intact  Orientation:  Full (Time, Place, and Person)  Thought Content: WDL   Suicidal Thoughts:  No  Homicidal Thoughts:  No  Memory:  Immediate;   Good Recent;   Good Remote;   Fair  Judgement:  Fair  Insight:  Present  Psychomotor Activity:  Normal  Concentration:  Concentration: Good and Attention Span: Good  Recall:  Good  Fund of Knowledge: Good  Language: Good  Akathisia:  No  Handed:  Right  AIMS (if indicated): not done  Assets:  Communication Skills Desire for Improvement Physical Health Transportation  ADL's:  Intact  Cognition: WNL  Sleep:  Fair   Screenings: AIMS    Flowsheet Row [facility]  AIMS Total Score 0   AUDIT    Flowsheet Row Ad[facility]  Alcohol Use Disorder Identification Test Final Score (AUDIT) 0   GAD-7    Flowsheet Row Clinical Support from 09/29/2024 in [facility] Clinical Support from 08/18/2024 in [facility] Clinical Support from 07/06/2024 in [facility] Clinical Support from 05/19/2024 in [facility] Clinical Support from 03/25/2024 in [facility]  Total GAD-7 Score 14 16 12 8 17    PHQ2-9    Flowsheet Row Clinical Support from 09/29/2024 in [facility] Clinical Support from 08/18/2024 in [facility] Clinical Support from 07/06/2024 in [facility] Clinical Support from 05/19/2024 in [facility] Clinical Support from 03/25/2024 in [facility]  PHQ-2 Total Score 3 1 5 1 5   PHQ-9 Total Score 12 12 17  -- 18   Flowsheet Row Clinical Support from 09/29/2024 in [facility] Clinical Support from 08/18/2024 in [facility] Clinical Support from 07/06/2024 in [facility]  C-SSRS RISK CATEGORY Moderate Risk Moderate Risk Moderate Risk     Assessment and Plan:   Larry BECKER Boleslaw Borghi. is a 29 year old male with a past psychiatric history significant for possible bipolar disorder, anxiety, and severe cannabis disorder who presents to [facility] for follow-up and medication management. ***  ***  A [place] Suicide Severity Rating Scale was performed with the patient being considered moderate risk.  Patient denies suicidal ideations and is able to contract for safety at this time.  Safety planning was discussed with the patient prior to the conclusion of the encounter.  - Patient was instructed to contact 911 in the event of a mental health crisis. - Patient was instructed to contact 988 Suicide and Crisis Lifeline in the event of a mental health crisis. - Patient was instructed to present to [facility] in the event of a mental health crisis.  Collaboration of Care: Collaboration of Care: Medication Management AEB provider managing patient's psychiatric medications, Psychiatrist AEB patient being followed by a mental health provider at this facility, and Referral or follow-up with counselor/therapist AEB patient being seen by licensed clinical social worker at this facility  Patient/Guardian was advised Release of Information must be obtained prior to any record release in order to collaborate their care with an outside provider. Patient/Guardian was advised if they have not already done  so to contact the registration department to sign all necessary forms in order for us  to release information regarding their care.   Consent: Patient/Guardian gives verbal consent for treatment and assignment of benefits for services provided during this visit. Patient/Guardian expressed understanding and agreed to proceed.   1. Moderate episode of recurrent major depressive disorder (HCC) (Primary) Major depressive disorder vs Bipolar I disorder Patient declines medication management at this time  2. Anxiety state Patient declines medication management at this time  3. Severe cannabis use disorder [facility])  Patient  to follow up in 6 weeks Provider spent a total of *** minutes with the patient/reviewing the patient's chart  Reginia FORBES Bolster, PA 09/29/2024, 2:00 PM

## 2024-11-11 ENCOUNTER — Encounter (HOSPITAL_COMMUNITY): Payer: MEDICAID | Admitting: Physician Assistant
